# Patient Record
Sex: Female | Born: 1950 | Race: White | Hispanic: No | Smoking: Current every day smoker
Health system: Southern US, Community
[De-identification: ages and names within clinical notes are randomized; demographics above are authoritative.]

## PROBLEM LIST (undated history)

## (undated) DIAGNOSIS — I10 Essential (primary) hypertension: Secondary | ICD-10-CM

## (undated) DIAGNOSIS — T7840XA Allergy, unspecified, initial encounter: Secondary | ICD-10-CM

## (undated) DIAGNOSIS — J439 Emphysema, unspecified: Secondary | ICD-10-CM

## (undated) DIAGNOSIS — F32A Depression, unspecified: Secondary | ICD-10-CM

## (undated) DIAGNOSIS — D649 Anemia, unspecified: Secondary | ICD-10-CM

## (undated) DIAGNOSIS — J45909 Unspecified asthma, uncomplicated: Secondary | ICD-10-CM

## (undated) DIAGNOSIS — F329 Major depressive disorder, single episode, unspecified: Secondary | ICD-10-CM

## (undated) DIAGNOSIS — M1990 Unspecified osteoarthritis, unspecified site: Secondary | ICD-10-CM

## (undated) DIAGNOSIS — F419 Anxiety disorder, unspecified: Secondary | ICD-10-CM

## (undated) DIAGNOSIS — K219 Gastro-esophageal reflux disease without esophagitis: Secondary | ICD-10-CM

## (undated) DIAGNOSIS — N200 Calculus of kidney: Secondary | ICD-10-CM

## (undated) HISTORY — DX: Major depressive disorder, single episode, unspecified: F32.9

## (undated) HISTORY — PX: APPENDECTOMY: SHX54

## (undated) HISTORY — DX: Anxiety disorder, unspecified: F41.9

## (undated) HISTORY — DX: Unspecified osteoarthritis, unspecified site: M19.90

## (undated) HISTORY — PX: HERNIA REPAIR: SHX51

## (undated) HISTORY — DX: Gastro-esophageal reflux disease without esophagitis: K21.9

## (undated) HISTORY — DX: Unspecified asthma, uncomplicated: J45.909

## (undated) HISTORY — DX: Essential (primary) hypertension: I10

## (undated) HISTORY — DX: Calculus of kidney: N20.0

## (undated) HISTORY — PX: CHOLECYSTECTOMY: SHX55

## (undated) HISTORY — PX: ABDOMINAL HYSTERECTOMY: SHX81

## (undated) HISTORY — DX: Depression, unspecified: F32.A

## (undated) HISTORY — DX: Emphysema, unspecified: J43.9

## (undated) HISTORY — DX: Allergy, unspecified, initial encounter: T78.40XA

## (undated) HISTORY — DX: Anemia, unspecified: D64.9

---
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
## 2001-04-28 ENCOUNTER — Encounter: Payer: Self-pay | Admitting: Family Medicine

## 2001-04-28 ENCOUNTER — Encounter: Admission: RE | Admit: 2001-04-28 | Discharge: 2001-04-28 | Payer: Self-pay | Admitting: Family Medicine

## 2007-09-02 ENCOUNTER — Emergency Department (HOSPITAL_COMMUNITY): Admission: EM | Admit: 2007-09-02 | Discharge: 2007-09-02 | Payer: Self-pay | Admitting: Emergency Medicine

## 2007-12-16 ENCOUNTER — Emergency Department (HOSPITAL_COMMUNITY): Admission: EM | Admit: 2007-12-16 | Discharge: 2007-12-16 | Payer: Self-pay | Admitting: Emergency Medicine

## 2009-06-08 ENCOUNTER — Emergency Department (HOSPITAL_BASED_OUTPATIENT_CLINIC_OR_DEPARTMENT_OTHER): Admission: EM | Admit: 2009-06-08 | Discharge: 2009-06-08 | Payer: Self-pay | Admitting: Emergency Medicine

## 2009-06-08 ENCOUNTER — Ambulatory Visit: Payer: Self-pay | Admitting: Diagnostic Radiology

## 2010-10-20 ENCOUNTER — Ambulatory Visit: Payer: Self-pay | Admitting: Diagnostic Radiology

## 2010-10-20 ENCOUNTER — Emergency Department (HOSPITAL_BASED_OUTPATIENT_CLINIC_OR_DEPARTMENT_OTHER): Admission: EM | Admit: 2010-10-20 | Discharge: 2010-10-20 | Payer: Self-pay | Admitting: Emergency Medicine

## 2010-10-29 ENCOUNTER — Emergency Department (HOSPITAL_BASED_OUTPATIENT_CLINIC_OR_DEPARTMENT_OTHER): Admission: EM | Admit: 2010-10-29 | Discharge: 2010-10-29 | Payer: Self-pay | Admitting: Emergency Medicine

## 2010-10-29 ENCOUNTER — Ambulatory Visit: Payer: Self-pay | Admitting: Diagnostic Radiology

## 2011-03-06 LAB — BASIC METABOLIC PANEL
Calcium: 8.9 mg/dL (ref 8.4–10.5)
Creatinine, Ser: 0.7 mg/dL (ref 0.4–1.2)
GFR calc Af Amer: >60 mL/min (ref >60)
Glucose, Bld: 95 mg/dL (ref 70–99)
Potassium: 3.8 mEq/L (ref 3.5–5.1)
Sodium: 145 mEq/L (ref 135–145)

## 2011-03-06 LAB — CBC
HCT: 40 % (ref 36.0–46.0)
MCH: 30 pg (ref 26.0–34.0)
Platelets: 286 10*3/uL (ref 150–400)
RDW: 12.4 % (ref 11.5–15.5)
WBC: 7.4 10*3/uL (ref 4.0–10.5)

## 2011-03-06 LAB — URINALYSIS, ROUTINE W REFLEX MICROSCOPIC
Glucose, UA: NEGATIVE mg/dL
Specific Gravity, Urine: 1.004 — ABNORMAL LOW (ref 1.005–1.030)

## 2011-03-06 LAB — URINE CULTURE

## 2011-03-06 LAB — GLUCOSE, CAPILLARY

## 2011-03-06 LAB — POCT CARDIAC MARKERS: Troponin i, poc: <0.05 ng/mL (ref 0.00–0.09)

## 2013-06-02 ENCOUNTER — Ambulatory Visit: Payer: Self-pay | Admitting: Family Medicine

## 2014-07-27 ENCOUNTER — Emergency Department (HOSPITAL_BASED_OUTPATIENT_CLINIC_OR_DEPARTMENT_OTHER): Payer: Self-pay

## 2014-07-27 ENCOUNTER — Encounter (HOSPITAL_BASED_OUTPATIENT_CLINIC_OR_DEPARTMENT_OTHER): Payer: Self-pay | Admitting: Emergency Medicine

## 2014-07-27 ENCOUNTER — Emergency Department (HOSPITAL_BASED_OUTPATIENT_CLINIC_OR_DEPARTMENT_OTHER)
Admission: EM | Admit: 2014-07-27 | Discharge: 2014-07-27 | Disposition: A | Discharge status: Home / Self Care | Payer: Self-pay | Attending: Emergency Medicine | Admitting: Emergency Medicine

## 2014-07-27 DIAGNOSIS — H9311 Tinnitus, right ear: Secondary | ICD-10-CM

## 2014-07-27 DIAGNOSIS — H9319 Tinnitus, unspecified ear: Secondary | ICD-10-CM | POA: Insufficient documentation

## 2014-07-27 DIAGNOSIS — R5381 Other malaise: Secondary | ICD-10-CM | POA: Insufficient documentation

## 2014-07-27 DIAGNOSIS — R42 Dizziness and giddiness: Secondary | ICD-10-CM | POA: Insufficient documentation

## 2014-07-27 DIAGNOSIS — F17200 Nicotine dependence, unspecified, uncomplicated: Secondary | ICD-10-CM | POA: Insufficient documentation

## 2014-07-27 DIAGNOSIS — J189 Pneumonia, unspecified organism: Secondary | ICD-10-CM

## 2014-07-27 DIAGNOSIS — R5383 Other fatigue: Secondary | ICD-10-CM | POA: Insufficient documentation

## 2014-07-27 DIAGNOSIS — J159 Unspecified bacterial pneumonia: Secondary | ICD-10-CM | POA: Insufficient documentation

## 2014-07-27 LAB — URINALYSIS, ROUTINE W REFLEX MICROSCOPIC
Bilirubin Urine: NEGATIVE
Glucose, UA: NEGATIVE mg/dL
HGB URINE DIPSTICK: NEGATIVE
KETONES UR: NEGATIVE mg/dL
Leukocytes, UA: NEGATIVE
Nitrite: NEGATIVE
Protein, ur: NEGATIVE mg/dL
Specific Gravity, Urine: 1.005 (ref 1.005–1.030)
UROBILINOGEN UA: 0.2 mg/dL (ref 0.0–1.0)
pH: 7 (ref 5.0–8.0)

## 2014-07-27 LAB — CBC WITH DIFFERENTIAL/PLATELET
Basophils Absolute: 0.1 10*3/uL (ref 0.0–0.1)
Basophils Relative: 1 % (ref 0–1)
EOS PCT: 5 % (ref 0–5)
Eosinophils Absolute: 0.5 10*3/uL (ref 0.0–0.7)
HCT: 39.5 % (ref 36.0–46.0)
HEMOGLOBIN: 13.1 g/dL (ref 12.0–15.0)
Lymphocytes Relative: 42 % (ref 12–46)
Lymphs Abs: 4.3 10*3/uL — ABNORMAL HIGH (ref 0.7–4.0)
MCH: 30.1 pg (ref 26.0–34.0)
MCHC: 33.2 g/dL (ref 30.0–36.0)
MCV: 90.8 fL (ref 78.0–100.0)
Monocytes Absolute: 0.9 10*3/uL (ref 0.1–1.0)
Monocytes Relative: 9 % (ref 3–12)
Neutro Abs: 4.5 10*3/uL (ref 1.7–7.7)
Neutrophils Relative %: 44 % (ref 43–77)
PLATELETS: 300 10*3/uL (ref 150–400)
RBC: 4.35 MIL/uL (ref 3.87–5.11)
RDW: 13.5 % (ref 11.5–15.5)
WBC: 10.3 10*3/uL (ref 4.0–10.5)

## 2014-07-27 LAB — COMPREHENSIVE METABOLIC PANEL
ALBUMIN: 3.7 g/dL (ref 3.5–5.2)
ALT: 18 U/L (ref 0–35)
ANION GAP: 11 (ref 5–15)
AST: 20 U/L (ref 0–37)
Alkaline Phosphatase: 95 U/L (ref 39–117)
BUN: 8 mg/dL (ref 6–23)
CO2: 29 mEq/L (ref 19–32)
CREATININE: 0.7 mg/dL (ref 0.50–1.10)
Calcium: 9.5 mg/dL (ref 8.4–10.5)
Chloride: 101 mEq/L (ref 96–112)
GFR calc Af Amer: >90 mL/min (ref >90)
GLUCOSE: 96 mg/dL (ref 70–99)
POTASSIUM: 3.8 meq/L (ref 3.7–5.3)
SODIUM: 141 meq/L (ref 137–147)
Total Bilirubin: 0.2 mg/dL — ABNORMAL LOW (ref 0.3–1.2)
Total Protein: 7.3 g/dL (ref 6.0–8.3)

## 2014-07-27 MED ORDER — ONDANSETRON HCL 4 MG PO TABS: 4.0000 mg | ORAL_TABLET | Freq: Four times a day (QID) | ORAL | Status: DC

## 2014-07-27 MED ORDER — ONDANSETRON 4 MG PO TBDP
4.0000 mg | ORAL_TABLET | Freq: Once | ORAL | Status: AC
  Administered 2014-07-27: 4 mg via ORAL

## 2014-07-27 MED ORDER — LORATADINE 10 MG PO TABS: 10.0000 mg | ORAL_TABLET | Freq: Every day | ORAL | Status: DC

## 2014-07-27 MED ORDER — SODIUM CHLORIDE 0.9 % IV BOLUS (SEPSIS)
1000.0000 mL | Freq: Once | INTRAVENOUS | Status: AC
  Administered 2014-07-27: 1000 mL via INTRAVENOUS

## 2014-07-27 MED ORDER — ONDANSETRON 4 MG PO TBDP
ORAL_TABLET | ORAL | Status: AC
  Filled 2014-07-27: qty 1

## 2014-07-27 MED ORDER — AMOXICILLIN 500 MG PO CAPS: 1000.0000 mg | ORAL_CAPSULE | Freq: Two times a day (BID) | ORAL | Status: DC

## 2014-07-27 NOTE — ED Provider Notes (Signed)
CSN: [id number]     Arrival date & time 07/27/14  1837 History   First MD Initiated Contact with Patient 07/27/14 1910     Chief Complaint  Patient presents with  . Dizziness     (Consider location/radiation/quality/duration/timing/severity/associated sxs/prior Treatment) Patient is a 63 y.o. female presenting with dizziness. The history is provided by the patient and a relative. No language interpreter was used.  Dizziness Quality:  Lightheadedness Severity:  Moderate Duration:  2 weeks Context: bending over, ear pain and standing up   Context: not with loss of consciousness   Associated symptoms: shortness of breath   Associated symptoms: no chest pain, no diarrhea, no headaches and no nausea   Associated symptoms comment:  She complains of lightheadedness without a sense of room-spinning. She feels lightheaded when she stands or bends forward. It started 2 weeks ago and is associated with mild nausea without vomiting, right ear pain and tinnitus without hearing loss and generalized fatigue and malaise. She has lost her appetite. She reports a large weight loss in the last 3 months. No pain other than right ear, no diarrhea, rashes or syncope. She does reports a cough and mild SOB but reports she is a smoker and this is not necessarily abnormal. No known fever.    History reviewed. No pertinent past medical history. Past Surgical History  Procedure Laterality Date  . Abdominal hysterectomy    . Hernia repair    . Splenectomy     No family history on file. History  Substance Use Topics  . Smoking status: Current Every Day Smoker  . Smokeless tobacco: Not on file  . Alcohol Use: No   OB History   Grav Para Term Preterm Abortions TAB SAB Ect Mult Living                 Review of Systems  Constitutional: Positive for appetite change and unexpected weight change. Negative for fever and chills.  HENT: Positive for ear pain and rhinorrhea. Negative for congestion, ear discharge  and sore throat.   Respiratory: Positive for cough and shortness of breath.   Cardiovascular: Negative.  Negative for chest pain.  Gastrointestinal: Negative.  Negative for nausea, abdominal pain and diarrhea.  Genitourinary: Negative.  Negative for dysuria.  Musculoskeletal: Negative.   Skin: Negative.  Negative for rash.  Neurological: Positive for light-headedness. Negative for syncope and headaches.  Psychiatric/Behavioral: Negative for confusion.      Allergies  Review of patient's allergies indicates no known allergies.  Home Medications   Prior to Admission medications   Not on File   BP 122/80  Pulse 82  Temp(Src) 98.2 F (36.8 C) (Oral)  Resp 16  Ht 5\' 4"  (1.626 m)  SpO2 93% Physical Exam  Constitutional: She is oriented to person, place, and time. She appears well-developed and well-nourished.  HENT:  Head: Normocephalic.  Left Ear: External ear normal.  Mouth/Throat: Oropharynx is clear and moist.  Right TM erythematous along upper border.  Eyes: Conjunctivae are normal.  Neck: Normal range of motion. Neck supple.  Cardiovascular: Normal rate and regular rhythm.   Pulmonary/Chest: Effort normal and breath sounds normal.  Abdominal: Soft. Bowel sounds are normal. There is no tenderness. There is no rebound and no guarding.  Musculoskeletal: Normal range of motion. She exhibits no edema.  Neurological: She is alert and oriented to person, place, and time.  No lateralizing weakness. Speech clear and focused. No facial asymmetry or deficits of coordination. CN's 3-12 grossly intact.  Skin: Skin is warm and dry. No rash noted.  Psychiatric: She has a normal mood and affect.    ED Course  Procedures (including critical care time) Labs Review Labs Reviewed  CBC WITH DIFFERENTIAL - Abnormal; Notable for the following:    Lymphs Abs 4.3 (*)    All other components within normal limits  COMPREHENSIVE METABOLIC PANEL - Abnormal; Notable for the following:     Total Bilirubin 0.2 (*)    All other components within normal limits  URINALYSIS, ROUTINE W REFLEX MICROSCOPIC    Imaging Review Dg Chest 2 View  07/27/2014   CLINICAL DATA:  Dizziness  EXAM: CHEST  2 VIEW  COMPARISON:  10/29/2010  FINDINGS: Vague density left lung base was not present previously and could represent pneumonia in the lingula. Right lung is clear. No pleural effusion or heart failure.  IMPRESSION: Question early infiltrate in the lingula versus is overlying soft tissues.   Electronically Signed   By: Franchot Gallo M.D.   On: 07/27/2014 21:53   Ct Head Wo Contrast  07/27/2014   CLINICAL DATA:  Dizziness  EXAM: CT HEAD WITHOUT CONTRAST  TECHNIQUE: Contiguous axial images were obtained from the base of the skull through the vertex without intravenous contrast.  COMPARISON:  None.  FINDINGS: Mild motion degrades image quality.  Ventricle size is normal. Negative for acute or chronic infarction. Negative for hemorrhage or fluid collection. Negative for mass or edema. No shift of the midline structures.  Calvarium is intact.  IMPRESSION: Negative   Electronically Signed   By: Franchot Gallo M.D.   On: 07/27/2014 21:06     EKG Interpretation None      MDM   Final diagnoses:  None    1. Tinnitus 2. Pneumonia, CAP 3. Weakness  The patient is non-toxic, appears fatigued but well. No neurologic deficts, no evidence to suggest sepsis or CVA. Symptoms are not vertiginous.  Discussed with Dr. Canary Brim. Will provide treatment for PNA, antihistamines to aid tinnitus. Given resource list for primary care and return precautions.  Dewaine Oats, PA-C 07/30/14 1232

## 2014-07-27 NOTE — Discharge Instructions (Signed)
Tinnitus Sounds you hear in your ears and coming from within the ear is called tinnitus. This can be a symptom of many ear disorders. It is often associated with hearing loss.  Tinnitus can be seen with:  Infections.  Ear blockages such as wax buildup.  Meniere's disease.  Ear damage.  Inherited.  Occupational causes. While irritating, it is not usually a threat to health. When the cause of the tinnitus is wax, infection in the middle ear, or foreign body it is easily treated. Hearing loss will usually be reversible.  TREATMENT  When treating the underlying cause does not get rid of tinnitus, it may be necessary to get rid of the unwanted sound by covering it up with more pleasant background noises. This may include music, the radio etc. There are tinnitus maskers which can be worn which produce background noise to cover up the tinnitus. Avoid all medications which tend to make tinnitus worse such as alcohol, caffeine, aspirin, and nicotine. There are many soothing background tapes such as rain, ocean, thunderstorms, etc. These soothing sounds help with sleeping or resting. Keep all follow-up appointments and referrals. This is important to identify the cause of the problem. It also helps avoid complications, impaired hearing, disability, or chronic pain. Document Released: 12/10/2005 Document Revised: 03/03/2012 Document Reviewed: 07/28/2008 [facility] Patient Information 2015 [place], [place]. This information is not intended to replace advice given to you by your health care provider. Make sure you discuss any questions you have with your health care provider. Pneumonia Pneumonia is an infection of the lungs.  CAUSES Pneumonia may be caused by bacteria or a virus. Usually, these infections are caused by breathing infectious particles into the lungs (respiratory tract). SIGNS AND SYMPTOMS   Cough.  Fever.  Chest pain.  Increased rate of breathing.  Wheezing.  Mucus  production. DIAGNOSIS  If you have the common symptoms of pneumonia, your health care provider will typically confirm the diagnosis with a chest X-ray. The X-ray will show an abnormality in the lung (pulmonary infiltrate) if you have pneumonia. Other tests of your blood, urine, or sputum may be done to find the specific cause of your pneumonia. Your health care provider may also do tests (blood gases or pulse oximetry) to see how well your lungs are working. TREATMENT  Some forms of pneumonia may be spread to other people when you cough or sneeze. You may be asked to wear a mask before and during your exam. Pneumonia that is caused by bacteria is treated with antibiotic medicine. Pneumonia that is caused by the influenza virus may be treated with an antiviral medicine. Most other viral infections must run their course. These infections will not respond to antibiotics.  HOME CARE INSTRUCTIONS   Cough suppressants may be used if you are losing too much rest. However, coughing protects you by clearing your lungs. You should avoid using cough suppressants if you can.  Your health care provider may have prescribed medicine if he or she thinks your pneumonia is caused by bacteria or influenza. Finish your medicine even if you start to feel better.  Your health care provider may also prescribe an expectorant. This loosens the mucus to be coughed up.  Take medicines only as directed by your health care provider.  Do not smoke. Smoking is a common cause of bronchitis and can contribute to pneumonia. If you are a smoker and continue to smoke, your cough may last several weeks after your pneumonia has cleared.  A cold steam vaporizer  or humidifier in your room or home may help loosen mucus.  Coughing is often worse at night. Sleeping in a semi-upright position in a recliner or using a couple pillows under your head will help with this.  Get rest as you feel it is needed. Your body will usually let you know  when you need to rest. PREVENTION A pneumococcal shot (vaccine) is available to prevent a common bacterial cause of pneumonia. This is usually suggested for:  People over 51 years old.  Patients on chemotherapy.  People with chronic lung problems, such as bronchitis or emphysema.  People with immune system problems. If you are over 65 or have a high risk condition, you may receive the pneumococcal vaccine if you have not received it before. In some countries, a routine influenza vaccine is also recommended. This vaccine can help prevent some cases of pneumonia.You may be offered the influenza vaccine as part of your care. If you smoke, it is time to quit. You may receive instructions on how to stop smoking. Your health care provider can provide medicines and counseling to help you quit. SEEK MEDICAL CARE IF: You have a fever. SEEK IMMEDIATE MEDICAL CARE IF:   Your illness becomes worse. This is especially true if you are elderly or weakened from any other disease.  You cannot control your cough with suppressants and are losing sleep.  You begin coughing up blood.  You develop pain which is getting worse or is uncontrolled with medicines.  Any of the symptoms which initially brought you in for treatment are getting worse rather than better.  You develop shortness of breath or chest pain. MAKE SURE YOU:   Understand these instructions.  Will watch your condition.  Will get help right away if you are not doing well or get worse. Document Released: 12/10/2005 Document Revised: 04/26/2014 Document Reviewed: 03/01/2011 [facility] Patient Information 2015 [place], [place]. This information is not intended to replace advice given to you by your health care provider. Make sure you discuss any questions you have with your health care provider.   Emergency Department Resource Guide 1) Find a Doctor and Pay Out of Pocket Although you won't have to find out who is covered by your insurance  plan, it is a good idea to ask around and get recommendations. You will then need to call the office and see if the doctor you have chosen will accept you as a new patient and what types of options they offer for patients who are self-pay. Some doctors offer discounts or will set up payment plans for their patients who do not have insurance, but you will need to ask so you aren't surprised when you get to your appointment.  2) Contact Your Local Health Department Not all health departments have doctors that can see patients for sick visits, but many do, so it is worth a call to see if yours does. If you don't know where your local health department is, you can check in your phone book. The CDC also has a tool to help you locate your state's health department, and many state websites also have listings of all of their local health departments.  3) Find a [facility] If your illness is not likely to be very severe or complicated, you may want to try a walk in clinic. These are popping up all over the country in pharmacies, drugstores, and shopping centers. They're usually staffed by nurse practitioners or physician assistants that have been trained to treat common  illnesses and complaints. They're usually fairly quick and inexpensive. However, if you have serious medical issues or chronic medical problems, these are probably not your best option.  No Primary Care Doctor: - Call Health Connect at  [phone] - they can help you locate a primary care doctor that  accepts your insurance, provides certain services, etc. - Physician Referral Service- [phone]  Chronic Pain Problems: Organization         Address  Phone   Notes  [facility]  [phone] Patients need to be referred by their primary care doctor.   Medication Assistance: Organization         Address  Phone   Notes  [facility] Medication [facility] [place], [place] [address]  [phone] --Must be a resident of [facility] -- Must have NO insurance coverage whatsoever (no Medicaid/ Medicare, etc.) -- The pt. MUST have a primary care doctor that directs their care regularly and follows them in the community   MedAssist  [phone]   [employer]  [phone]    Agencies that provide inexpensive medical care: Organization         Address  Phone   Notes  [place]  [phone]   [facility]    [phone]   [facility] [place], [place] [address] [phone]   [place] [address], [place] [phone]   Planned Parenthood    [phone]   [facility]    [phone]   [place] and [place] [address], [place] Phone:  [phone], Fax:  [phone] Hours of Operation:  9 am - 6 pm, M-F.  Also accepts Medicaid/Medicare and self-pay.  [facility] for [place] [address], [address], [place] Phone: [phone], Fax: [phone]. Hours of Operation:  8:30 am - 5:30 pm, M-F.  Also accepts Medicaid and self-pay.  [facility] [place] [address], [place] Phone: [phone]   [place], [place], [place] [phone], Ext. [phone] Mondays & Thursdays: 7-9 AM.  First 15 patients are seen on a first come, first serve basis.    [place] Providers:  Organization         Address  Phone   Notes  [facility] [address], [place] and [place] [phone] Also accepts self-pay patients.  [facility] [address], [place]  [phone]   [place], [address], [place] [phone]   [facility] [address] [place] [phone]   Lucianne Lei [address], [place]   [phone] Only accepts [place] Access [place] patients after they have their name applied to their card.   Self-Pay (no insurance) in [facility]:  Organization         Address  Phone   Notes  Sickle Cell Patients, [facility] [place] [phone]   [facility] Urgent Care [place] [phone]   [facility] [place]  [place], [place], [place] [phone]   [facility]/Dr. Osei-Bonsu  [address] [place] or [address], [address],  [place] [phone] Phone number for both [facility] and [place] locations is the same.  Urgent Medical and [facility] [address], [place] [phone]   [facility] [address], [place] or [address] [phone]   [facility] [address], [place] [phone], phone; [phone], fax Sees patients 1st and 3rd Saturday of every month.  Must not qualify for public or private insurance (i.e. Medicaid, Medicare, [place] Health Choice, Veterans' Benefits)  Household income should be no more than 200% of the poverty level The clinic cannot treat you if you are pregnant or think you are pregnant  Sexually transmitted diseases are not treated at the clinic.    Dental Care: Organization         Address  Phone  Notes  [facility] Department of [facility] [place] [phone] Accepts children up to age 44 who are enrolled in [place] or [place]; pregnant women with a Medicaid card; and children who have applied for Medicaid or [place] Health Choice, but were declined, whose parents can pay a reduced fee at time of service.  [facility] Department of [facility]  [address], [place] [phone] Accepts children up to age 108 who are enrolled in [place] or [place]; pregnant women with a Medicaid card; and children who have applied for Medicaid or [place] Health Choice, but were declined, whose parents can pay a reduced fee at time of service.  [place] Adult Dental Access PROGRAM  [place] [phone] Patients are seen by appointment only. Walk-ins are not accepted. [place] will see patients 8 years of age and older. Monday - Tuesday (8am-5pm) Most Wednesdays (8:30-5pm) $30 per visit, cash only  [facility] Adult Dental Access PROGRAM  [address], [place] [phone] Patients are seen by appointment only. Walk-ins are not accepted. [place] will see patients 6 years of age and older. One Wednesday Evening (Monthly: Volunteer Based).  $30 per visit, cash only  [place]  [phone] for adults; Children under age 38, call Graduate Pediatric Dentistry at [phone]. Children aged 1-14, please call [phone] to request a pediatric application.  Dental services are provided in all areas of dental care including fillings, crowns and bridges, complete and partial dentures, implants, gum treatment, root canals, and extractions. Preventive care is also provided. Treatment is provided to both adults and children. Patients are selected via a lottery and there is often a waiting list.   [facility] [address], [place]  [phone] [email or web address]   [facility] [address] [place], [place] [phone], Ext. [phone] Second and Fourth Thursday of each month, opens at 6:30 AM; Clinic ends at 9 AM.  Patients are seen on a first-come first-served basis, and a limited number are seen during each clinic.   [facility]  [address] [place]ard Danker [place], [place] [phone]   Eligibility Requirements You must have lived in [place], [place], or [place] counties for at least the last three months.   You cannot be eligible for state or  federal sponsored [employer], including [employer], [place], or [employer].   You generally cannot be eligible for healthcare insurance through your employer.    How to apply: Eligibility screenings are held every Tuesday and  Wednesday afternoon from 1:00 pm until 4:00 pm. You do not need an appointment for the interview!  [facility] [address] [place], [place]   [place]  [place] Department  [place]  [phone]    Behavioral Health Resources in the Community: Intensive Outpatient Programs Organization         Address  Phone  Notes  [place]. [address], [place], [place] [phone]   [facility] [address], [place], [place]   ADS: Alcohol & Drug Svcs [address], [place], [place]   [place] [address],  [place], [place] or [phone]   Substance Abuse Resources Organization         Address  Phone  Notes  Alcohol and Drug Services  [phone]   [place]  [phone]   [facility] [place]   Chinita Pester  [phone]   Residential & Outpatient Substance Abuse Program  [phone]   Psychological Services Organization         Address  Phone  Notes  [facility] [place]  [place]  [phone]   [place] [address] [place] or [phone]    Mobile Crisis Teams Organization         Address  Phone  Notes  [facility], Mobile Crisis Care Unit  [phone]   [facility]  [address] [place], [place]   [place] [address], [place] [phone]    Self-Help/Support Groups Organization         Address  Phone              Notes  [place]. of [place] - variety of support groups  [place] Call for more information  Narcotics Anonymous (NA), [facility] [address], [employer] [place]  2 meetings at this location   Special educational needs teacher         Address  Phone  Notes  ASAP Residential Treatment [place],    [place]  [phone]   [facility]  [address] [place] [address], [place], [place]   [place], [place] [phone] Admissions: 8am-3pm M-F  Incentives Substance [place] [address],    [place], [place] [phone]   [facility] [address] [place], [place], [place]   [facility] [address]  [place], [place]   [facility]ight Programs - Intensive Outpatient [address], [place], [place]   [facility] ([place].,  [place], [place] [phone] or [phone]   [facility] Services (RTS) [address], [place], [place] Accepts Medicaid  [facility] [place] [address],  [place] [phone] Substance Abuse/Addiction Treatment   [facility] Organization         Address  Phone  Notes  [facility]  [phone]   Domenic Schwab, PhD [address] Arlis Porta [place], [place]   [phone] or [phone]   [place], [place] [phone]   [place] [address], [place], [place] [phone] Insurance/Medicaid/sponsorship through [employer] and  Families [address] [place], [place] [phone] [place], [place] [phone]    Dr. Adele Schilder  [phone]   [facility]  Dept. 1) [address] [place] 2) [place] 3)  [place] [address], [place] [phone]  [phone]   [place] [phone] or [phone] (After Hours)

## 2014-07-27 NOTE — ED Notes (Signed)
Pt reports she has been loosing her balance, hears a "buzzing" in her ears, congestion for several weeks. Pt says that she has been having lightheadedness and headache today

## 2014-08-02 NOTE — ED Provider Notes (Signed)
Medical screening examination/treatment/procedure(s) were performed by non-physician practitioner and as supervising physician I was immediately available for consultation/collaboration.   EKG Interpretation   Date/Time:  Tuesday July 27 2014 18:57:14 EDT Ventricular Rate:  80 PR Interval:  164 QRS Duration: 86 QT Interval:  390 QTC Calculation: 449 R Axis:   13 Text Interpretation:  Normal sinus rhythm Possible Anterolateral infarct ,  age undetermined Abnormal ECG No significant change since last tracing  Confirmed by Canary Brim  MD, [place] [phone]) on 07/27/2014 10:24:04 PM       Threasa Beards, MD 08/02/14 1505

## 2015-01-09 ENCOUNTER — Ambulatory Visit (INDEPENDENT_AMBULATORY_CARE_PROVIDER_SITE_OTHER): Payer: 59 | Admitting: Family Medicine

## 2015-01-09 VITALS — BP 142/78 | HR 74 | Temp 97.9°F | Resp 16 | Ht 63.75 in | Wt 154.0 lb

## 2015-01-09 DIAGNOSIS — G44219 Episodic tension-type headache, not intractable: Secondary | ICD-10-CM

## 2015-01-09 DIAGNOSIS — R634 Abnormal weight loss: Secondary | ICD-10-CM

## 2015-01-09 DIAGNOSIS — R42 Dizziness and giddiness: Secondary | ICD-10-CM

## 2015-01-09 DIAGNOSIS — R0981 Nasal congestion: Secondary | ICD-10-CM

## 2015-01-09 DIAGNOSIS — F17210 Nicotine dependence, cigarettes, uncomplicated: Secondary | ICD-10-CM

## 2015-01-09 DIAGNOSIS — H9313 Tinnitus, bilateral: Secondary | ICD-10-CM

## 2015-01-09 LAB — COMPREHENSIVE METABOLIC PANEL
ALBUMIN: 3.6 g/dL (ref 3.5–5.2)
ALK PHOS: 89 U/L (ref 39–117)
ALT: 13 U/L (ref 0–35)
AST: 14 U/L (ref 0–37)
BILIRUBIN TOTAL: 0.5 mg/dL (ref 0.2–1.2)
BUN: 8 mg/dL (ref 6–23)
CALCIUM: 8.9 mg/dL (ref 8.4–10.5)
CO2: 29 mEq/L (ref 19–32)
Chloride: 106 mEq/L (ref 96–112)
Creat: 0.63 mg/dL (ref 0.50–1.10)
GLUCOSE: 84 mg/dL (ref 70–99)
POTASSIUM: 4.4 meq/L (ref 3.5–5.3)
Sodium: 139 mEq/L (ref 135–145)
Total Protein: 6.6 g/dL (ref 6.0–8.3)

## 2015-01-09 LAB — POCT CBC
Granulocyte percent: 47.6 %G (ref 37–80)
HEMATOCRIT: 43.8 % (ref 37.7–47.9)
HEMOGLOBIN: 13.5 g/dL (ref 12.2–16.2)
LYMPH, POC: 3.5 — AB (ref 0.6–3.4)
MCH: 29.1 pg (ref 27–31.2)
MCHC: 30.8 g/dL — AB (ref 31.8–35.4)
MCV: 94.5 fL (ref 80–97)
MID (CBC): 0.7 (ref 0–0.9)
MPV: 7.3 fL (ref 0–99.8)
PLATELET COUNT, POC: 336 10*3/uL (ref 142–424)
POC Granulocyte: 3.8 (ref 2–6.9)
POC LYMPH %: 43.9 % (ref 10–50)
POC MID %: 8.5 %M (ref 0–12)
RBC: 4.63 M/uL (ref 4.04–5.48)
RDW, POC: 16.2 %
WBC: 7.9 10*3/uL (ref 4.6–10.2)

## 2015-01-09 LAB — POCT URINALYSIS DIPSTICK
BILIRUBIN UA: NEGATIVE
Blood, UA: NEGATIVE
Glucose, UA: NEGATIVE
Ketones, UA: NEGATIVE
Leukocytes, UA: NEGATIVE
Nitrite, UA: NEGATIVE
PH UA: 5
PROTEIN UA: NEGATIVE
SPEC GRAV UA: 1.015
Urobilinogen, UA: 0.2

## 2015-01-09 LAB — POCT UA - MICROSCOPIC ONLY
BACTERIA, U MICROSCOPIC: 0
CASTS, UR, LPF, POC: 0
CRYSTALS, UR, HPF, POC: 0
MUCUS UA: 0
RBC, urine, microscopic: 0
WBC, UR, HPF, POC: 0
YEAST UA: 0

## 2015-01-09 LAB — POCT GLYCOSYLATED HEMOGLOBIN (HGB A1C): HEMOGLOBIN A1C: 5.8

## 2015-01-09 LAB — TSH: TSH: 1.162 u[IU]/mL (ref 0.350–4.500)

## 2015-01-09 MED ORDER — FLUTICASONE PROPIONATE 50 MCG/ACT NA SUSP: 2.0000 | Freq: Every day | NASAL | Status: DC

## 2015-01-09 MED ORDER — ACETAMINOPHEN 500 MG PO TABS: 1000.0000 mg | ORAL_TABLET | Freq: Three times a day (TID) | ORAL | Status: DC | PRN

## 2015-01-09 MED ORDER — CETIRIZINE-PSEUDOEPHEDRINE ER 5-120 MG PO TB12: 1.0000 | ORAL_TABLET | Freq: Two times a day (BID) | ORAL | Status: DC

## 2015-01-09 NOTE — Progress Notes (Signed)
IDENTIFYING INFORMATION  Samantha Krueger / DOB: Oct 04, 1951 / MRN: [id number]  The patient  does not have a problem list on file.  SUBJECTIVE  CC: Tinnitus and Sinusitis   HPI: [name] is a 64 y.o. y.o. female presenting for tinnitus, ear fullness, and ear popping.  This all started in in June of 15 when she presented for the above and was diagnosed with allergies and pneumonia.  She was given Claritin along with Amoxicillin and Zofran for nausea.  She reports her pneumonia symptoms improved, but her ear symptoms did not.  As of December of 15 her left ear started ringing and popping. She denies hearing loss. She reports taking Excedrin for HA. She continues to take Claritin and reports that she is worse if she misses a dose.   She also reports episodes of dizziness that are associated with head movement.  These episodes last five or so minutes.  She denies feeling like she is going to black out.  This episodes occur sporadically.  She denies chest pain and palpitations.    She reports having frontal headaches all the time.  She denies pulsatility, vision changes, photophobia, and nausea with these.   She reports an unintentional 40 lbs weight loss over the the last year.    She  has a past medical history of Allergy; Anxiety; Arthritis; and Depression.    She currently has no medications in their medication list.  Ms. Pieratt has No Known Allergies. She  reports that she has been smoking.  She does not have any smokeless tobacco history on file. She reports that she does not drink alcohol or use illicit drugs. She  has no sexual activity history on file.  The patient  has past surgical history that includes Abdominal hysterectomy; Hernia repair; Splenectomy; and Cholecystectomy.  Her family history includes Heart disease in her mother; Hyperlipidemia in her mother.  Review of Systems  Constitutional: Negative for fever, chills and weight loss.  HENT: Positive for congestion,  sore throat and tinnitus. Negative for ear discharge and hearing loss.   Eyes: Negative for blurred vision, double vision and photophobia.  Respiratory: Positive for cough.   Cardiovascular: Negative for chest pain and palpitations.  Gastrointestinal: Positive for diarrhea. Negative for abdominal pain and constipation.  Genitourinary: Positive for frequency. Negative for dysuria and urgency.  Skin: Positive for itching (eyes, nose). Negative for rash.  Neurological: Positive for headaches.    OBJECTIVE  Blood pressure 142/78, pulse 74, temperature 97.9 F (36.6 C), temperature source Oral, resp. rate 16, height 5' 3.75" (1.619 m), weight 154 lb (69.854 kg), SpO2 97 %. The patient's body mass index is 26.65 kg/(m^2).  Physical Exam  HENT:  Head:    Right Ear: Hearing, external ear and ear canal normal. Tympanic membrane is retracted.  Left Ear: Hearing, external ear and ear canal normal. Tympanic membrane is retracted.  Ears:  Nose: Mucosal edema present. Right sinus exhibits no maxillary sinus tenderness and no frontal sinus tenderness. Left sinus exhibits no maxillary sinus tenderness and no frontal sinus tenderness.  Mouth/Throat: Uvula is midline, oropharynx is clear and moist and mucous membranes are normal.  Cardiovascular: Normal rate and regular rhythm.   Respiratory: Effort normal and breath sounds normal. No respiratory distress.    Results for orders placed or performed in visit on 01/09/15 (from the past 24 hour(s))  POCT CBC     Status: Abnormal   Collection Time: 01/09/15 12:21 PM  Result Value Ref Range  WBC 7.9 4.6 - 10.2 K/uL   Lymph, poc 3.5 (A) 0.6 - 3.4   POC LYMPH PERCENT 43.9 10 - 50 %L   MID (cbc) 0.7 0 - 0.9   POC MID % 8.5 0 - 12 %M   POC Granulocyte 3.8 2 - 6.9   Granulocyte percent 47.6 37 - 80 %G   RBC 4.63 4.04 - 5.48 M/uL   Hemoglobin 13.5 12.2 - 16.2 g/dL   HCT, POC 43.8 37.7 - 47.9 %   MCV 94.5 80 - 97 fL   MCH, POC 29.1 27 - 31.2 pg    MCHC 30.8 (A) 31.8 - 35.4 g/dL   RDW, POC 16.2 %   Platelet Count, POC 336 142 - 424 K/uL   MPV 7.3 0 - 99.8 fL  POCT glycosylated hemoglobin (Hb A1C)     Status: None   Collection Time: 01/09/15 12:33 PM  Result Value Ref Range   Hemoglobin A1C 5.8   POCT urinalysis dipstick     Status: None   Collection Time: 01/09/15 12:59 PM  Result Value Ref Range   Color, UA yellow    Clarity, UA clear    Glucose, UA neg    Bilirubin, UA neg    Ketones, UA neg    Spec Grav, UA 1.015    Blood, UA neg    pH, UA 5.0    Protein, UA neg    Urobilinogen, UA 0.2    Nitrite, UA neg    Leukocytes, UA Negative   POCT UA - Microscopic Only     Status: None   Collection Time: 01/09/15  1:01 PM  Result Value Ref Range   WBC, Ur, HPF, POC 0    RBC, urine, microscopic 0    Bacteria, U Microscopic 0    Mucus, UA 0    Epithelial cells, urine per micros 0-1    Crystals, Ur, HPF, POC 0    Casts, Ur, LPF, POC 0    Yeast, UA 0    Peak flow reading is 150, about 39% of predicted.  ASSESSMENT & PLAN  Marixa was seen today for tinnitus and sinusitis.  Diagnoses and associated orders for this visit:  Tinnitus, bilateral:  Likely secondary to sinus congestion that is driving eustachian tube dysfunction.  - POCT glycosylated hemoglobin (Hb A1C) - TSH -     Advised to avoid ASA and to decrease coffee consumtpoms  Dizziness: Likely driven by process one.  - EKG 12-Lead - Comprehensive metabolic panel - POCT CBC - POCT glycosylated hemoglobin (Hb A1C)  Sinus congestion: Allergies vs. Infectious etiology.  The latter is unlikely given a full course of appropriately dosed Amoxicillin without improvement in her symptoms.  - POCT CBC  Episodic tension-type headache, not intractable - POCT CBC  Weight loss, unintentional - POCT urinalysis dipstick - POCT UA - Microscopic Only - TSH - Peak flow meter -     Likely secondary to smoking and high probability of COPD.  Will order PFTs at next visit.    Smoking greater than 25 pack years - Peak flow meter: 39% of normal -     Check spirometry at f/u in two weeks.  -     Will investigate the possibility of receiving a helical low dose lung CT.      The patient was instructed to to call or comeback to clinic as needed, or should symptoms warrant.  Philis Fendt, MHS, PA-C [facility]  01/09/2015 1:05 PM

## 2015-01-22 ENCOUNTER — Ambulatory Visit (INDEPENDENT_AMBULATORY_CARE_PROVIDER_SITE_OTHER): Payer: 59 | Admitting: Family Medicine

## 2015-01-22 VITALS — BP 132/76 | HR 83 | Temp 98.3°F | Resp 16 | Ht 63.75 in | Wt 150.0 lb

## 2015-01-22 DIAGNOSIS — Z23 Encounter for immunization: Secondary | ICD-10-CM

## 2015-01-22 DIAGNOSIS — R634 Abnormal weight loss: Secondary | ICD-10-CM

## 2015-01-22 DIAGNOSIS — J449 Chronic obstructive pulmonary disease, unspecified: Secondary | ICD-10-CM

## 2015-01-22 DIAGNOSIS — H9311 Tinnitus, right ear: Secondary | ICD-10-CM

## 2015-01-22 DIAGNOSIS — F17210 Nicotine dependence, cigarettes, uncomplicated: Secondary | ICD-10-CM

## 2015-01-22 MED ORDER — IPRATROPIUM-ALBUTEROL 18-103 MCG/ACT IN AERO: 1.0000 | INHALATION_SPRAY | Freq: Four times a day (QID) | RESPIRATORY_TRACT | Status: DC | PRN

## 2015-01-22 MED ORDER — PSEUDOEPHEDRINE HCL 60 MG PO TABS: ORAL_TABLET | ORAL | Status: DC

## 2015-01-22 MED ORDER — ALBUTEROL SULFATE (2.5 MG/3ML) 0.083% IN NEBU
2.5000 mg | INHALATION_SOLUTION | Freq: Once | RESPIRATORY_TRACT | Status: AC
  Administered 2015-01-22: 2.5 mg via RESPIRATORY_TRACT

## 2015-01-22 MED ORDER — IPRATROPIUM BROMIDE 0.02 % IN SOLN
0.5000 mg | Freq: Once | RESPIRATORY_TRACT | Status: AC
Start: 2015-01-22 — End: 2015-01-22
  Administered 2015-01-22: 0.5 mg via RESPIRATORY_TRACT

## 2015-01-22 NOTE — Progress Notes (Addendum)
01/22/2015 at 6:34 PM  Samantha Krueger / DOB: [birth year] / MRN: [id number]  The patient has COPD (chronic obstructive pulmonary disease); Need for prophylactic vaccination and inoculation against influenza; and Need for prophylactic vaccination against Streptococcus pneumoniae (pneumococcus) on her problem list.  SUBJECTIVE  Chief compalaint: Follow-up and Tinnitus   History of present illness: Samantha Krueger is 64 y.o. frail appearing female presenting for the follow up of tinnitus.  She was seen roughly two weeks ago and was prescribed Flonase along with Zyrtec-D and reports a great improvement in her symptoms.  She denies ringing during the day time now, and reports she only hears it at night when things are quiet.  She denies palpitations, sleeplessness and anxiety associated with the medications.   She reports that she can walk roughly 100 yards and then needs stop secondary to SOB. She denies chest pain, diaphoresis, and presyncope.  She reports two "realy bad chest colds" last year. She has a 48 year history of smoking. She continues to lose weight, however she states that she "just does not eat a lot."  She continues to smoke, and is just not ready to quit.    She  has a past medical history of Allergy; Anxiety; Arthritis; and Depression.    She has a current medication list which includes the following prescription(s): acetaminophen, cetirizine-pseudoephedrine, and fluticasone.  Samantha Krueger has No Known Allergies. She  reports that she has been smoking.  She does not have any smokeless tobacco history on file. She reports that she does not drink alcohol or use illicit drugs. She  has no sexual activity history on file.  The patient  has past surgical history that includes Abdominal hysterectomy; Hernia repair; Splenectomy; and Cholecystectomy.  Her family history includes Heart disease in her mother; Hyperlipidemia in her mother.  Review of Systems  Constitutional: Negative for  fever, chills and weight loss.  HENT: Positive for congestion and tinnitus (improved). Negative for ear pain and sore throat.   Respiratory: Negative for shortness of breath.   Cardiovascular: Negative for chest pain and palpitations.  Neurological: Negative for dizziness, tingling, speech change, seizures and headaches.   Office Spirometry Results: FEV1: 1.38 liters FVC: 2.39 liters (post neb) FEV1/FVC: 57.7 % FVC  % Predicted: 79 liters FEV % Predicted: 52 liters FeF 25-75: 0.54 liters FeF 25-75 % Predicted: 2.16   OBJECTIVE   height is 5' 3.75" (1.619 m) and weight is 150 lb (68.04 kg). Her oral temperature is 98.3 F (36.8 C). Her blood pressure is 132/76 and her pulse is 83. Her respiration is 16 and oxygen saturation is 95%.  The patient's body mass index is 25.96 kg/(m^2).  Physical Exam  Constitutional: She is oriented to person, place, and time.  HENT:  Right Ear: Hearing, tympanic membrane, external ear and ear canal normal. Tympanic membrane is not erythematous and not retracted.  Left Ear: Hearing, tympanic membrane, external ear and ear canal normal. Tympanic membrane is not erythematous and not retracted.  Nose: No mucosal edema.  Cardiovascular: Normal rate.   Respiratory: She has no wheezes. She exhibits no tenderness.  GI: Soft. She exhibits no distension.  Neurological: She is alert and oriented to person, place, and time. No cranial nerve deficit.  Skin: Skin is warm and dry.  Psychiatric: She has a normal mood and affect. Her behavior is normal. Judgment and thought content normal.    No results found for this or any previous visit (from the past 24 hour(s)).  ASSESSMENT & PLAN  Samantha Krueger was seen today for follow-up and tinnitus.  Diagnoses and associated orders for this visit:  Right-sided tinnitus: Greatly improved and was resolved as of 2 days ago before right sided reemergence. HPI and physical exam encouraging.  Will increase sudafed by one 60 mg tab  at 3 pm.   - pseudoephedrine (SUDAFED) 60 MG tablet; Take at 3 or 4 pm daily for tinnitus. -     Advised that she continue Zyrtec-D nightly along with daily Flonase.   -     Consider 24 hour Zyrtec-D formulation if no improvement or worsening.  Consider adding azelastine for more histamine coverage if the above fails.    Chronic obstructive pulmonary disease, unspecified COPD, unspecified chronic bronchitis type - albuterol (PROVENTIL) (2.5 MG/3ML) 0.083% nebulizer solution 2.5 mg; Take 3 mLs (2.5 mg total) by nebulization once. - ipratropium (ATROVENT) nebulizer solution 0.5 mg; Take 2.5 mLs (0.5 mg total) by nebulization once. - albuterol-ipratropium (COMBIVENT) 18-103 MCG/ACT inhaler; Inhale 1-2 puffs into the lungs every 6 (six) hours as needed for wheezing or shortness of breath -     Follow up in two weeks to assess symptoms and provide immunizations.   Unintentional weight loss: Most likely being driven by the second problem.  Patient declined referral for colonoscopy but her blood counts are reassuring in that respect.  She does not complain of chronic cough or voice change.  Will pursue chest CT screening in future.  Smoking greater than 25 pack years: Driving the second process.  Patient not ready to quit.  Hopefully she will pursue the pulmonary rehab referral to change her outlook on quitting.    The patient was instructed to to call or comeback to clinic as needed, or should symptoms warrant.  Philis Fendt, MHS, PA-C [facility] 01/22/2015 6:34 PM

## 2015-01-22 NOTE — Patient Instructions (Signed)

## 2018-01-11 ENCOUNTER — Ambulatory Visit (INDEPENDENT_AMBULATORY_CARE_PROVIDER_SITE_OTHER): Payer: Medicare Other | Admitting: Family Medicine

## 2018-01-11 ENCOUNTER — Ambulatory Visit (INDEPENDENT_AMBULATORY_CARE_PROVIDER_SITE_OTHER): Payer: Medicare Other

## 2018-01-11 VITALS — BP 148/98 | HR 91 | Temp 98.3°F | Resp 16 | Ht 63.0 in | Wt 168.0 lb

## 2018-01-11 DIAGNOSIS — F17200 Nicotine dependence, unspecified, uncomplicated: Secondary | ICD-10-CM

## 2018-01-11 DIAGNOSIS — Z5181 Encounter for therapeutic drug level monitoring: Secondary | ICD-10-CM

## 2018-01-11 DIAGNOSIS — R918 Other nonspecific abnormal finding of lung field: Secondary | ICD-10-CM

## 2018-01-11 DIAGNOSIS — R739 Hyperglycemia, unspecified: Secondary | ICD-10-CM

## 2018-01-11 DIAGNOSIS — R0781 Pleurodynia: Secondary | ICD-10-CM

## 2018-01-11 DIAGNOSIS — R05 Cough: Secondary | ICD-10-CM | POA: Diagnosis not present

## 2018-01-11 DIAGNOSIS — J90 Pleural effusion, not elsewhere classified: Secondary | ICD-10-CM | POA: Diagnosis not present

## 2018-01-11 DIAGNOSIS — T380X5A Adverse effect of glucocorticoids and synthetic analogues, initial encounter: Secondary | ICD-10-CM

## 2018-01-11 DIAGNOSIS — R0789 Other chest pain: Secondary | ICD-10-CM

## 2018-01-11 DIAGNOSIS — E663 Overweight: Secondary | ICD-10-CM | POA: Diagnosis not present

## 2018-01-11 DIAGNOSIS — R059 Cough, unspecified: Secondary | ICD-10-CM

## 2018-01-11 DIAGNOSIS — J441 Chronic obstructive pulmonary disease with (acute) exacerbation: Secondary | ICD-10-CM

## 2018-01-11 DIAGNOSIS — R7303 Prediabetes: Secondary | ICD-10-CM

## 2018-01-11 DIAGNOSIS — R030 Elevated blood-pressure reading, without diagnosis of hypertension: Secondary | ICD-10-CM

## 2018-01-11 DIAGNOSIS — Z79899 Other long term (current) drug therapy: Secondary | ICD-10-CM

## 2018-01-11 LAB — POCT CBC
Granulocyte percent: 64.6 %G (ref 37–80)
HCT, POC: 40.6 % (ref 37.7–47.9)
Hemoglobin: 13.3 g/dL (ref 12.2–16.2)
Lymph, poc: 3.3 (ref 0.6–3.4)
MCH, POC: 28.3 pg (ref 27–31.2)
MCHC: 32.8 g/dL (ref 31.8–35.4)
MCV: 86.3 fL (ref 80–97)
MID (CBC): 1 — AB (ref 0–0.9)
MPV: 7.4 fL (ref 0–99.8)
PLATELET COUNT, POC: 472 10*3/uL — AB (ref 142–424)
POC Granulocyte: 7.9 — AB (ref 2–6.9)
POC LYMPH PERCENT: 27.2 %L (ref 10–50)
POC MID %: 82 % — AB (ref 0–12)
RBC: 4.71 M/uL (ref 4.04–5.48)
RDW, POC: 13.6 %
WBC: 12.3 10*3/uL — AB (ref 4.6–10.2)

## 2018-01-11 MED ORDER — ALBUTEROL SULFATE HFA 108 (90 BASE) MCG/ACT IN AERS: 2.0000 | INHALATION_SPRAY | RESPIRATORY_TRACT | 2 refills | Status: DC | PRN

## 2018-01-11 MED ORDER — ALBUTEROL SULFATE (2.5 MG/3ML) 0.083% IN NEBU
2.5000 mg | INHALATION_SOLUTION | Freq: Once | RESPIRATORY_TRACT | Status: AC
  Administered 2018-01-11: 2.5 mg via RESPIRATORY_TRACT

## 2018-01-11 MED ORDER — GUAIFENESIN-CODEINE 100-10 MG/5ML PO SYRP: 10.0000 mL | ORAL_SOLUTION | Freq: Four times a day (QID) | ORAL | 0 refills | Status: DC | PRN

## 2018-01-11 MED ORDER — PREDNISONE 20 MG PO TABS: 40.0000 mg | ORAL_TABLET | Freq: Every day | ORAL | 0 refills | Status: DC

## 2018-01-11 MED ORDER — IPRATROPIUM BROMIDE 0.02 % IN SOLN
0.5000 mg | Freq: Once | RESPIRATORY_TRACT | Status: AC
  Administered 2018-01-11: 0.5 mg via RESPIRATORY_TRACT

## 2018-01-11 MED ORDER — AZITHROMYCIN 250 MG PO TABS: ORAL_TABLET | ORAL | 0 refills | Status: DC

## 2018-01-11 NOTE — Patient Instructions (Addendum)
     IF you received an x-ray today, you will receive an invoice from [facility]. Please contact  Radiology at [phone] with questions or concerns regarding your invoice.   IF you received labwork today, you will receive an invoice from [facility]. Please contact [facility] at [phone] with questions or concerns regarding your invoice.   Our billing staff will not be able to assist you with questions regarding bills from these companies.  You will be contacted with the lab results as soon as they are available. The fastest way to get your results is to activate your My Chart account. Instructions are located on the last page of this paperwork. If you have not heard from us regarding the results in 2 weeks, please contact this office.      Chronic Obstructive Pulmonary Disease Exacerbation Chronic obstructive pulmonary disease (COPD) is a common lung problem. In COPD, the flow of air from the lungs is limited. COPD exacerbations are times that breathing gets worse and you need extra treatment. Without treatment they can be life threatening. If they happen often, your lungs can become more damaged. If your COPD gets worse, your doctor may treat you with:  Medicines.  Oxygen.  Different ways to clear your airway, such as using a mask.  Follow these instructions at home:  Do not smoke.  Avoid tobacco smoke and other things that bother your lungs.  If given, take your antibiotic medicine as told. Finish the medicine even if you start to feel better.  Only take medicines as told by your doctor.  Drink enough fluids to keep your pee (urine) clear or pale yellow (unless your doctor has told you not to).  Use a cool mist machine (vaporizer).  If you use oxygen or a machine that turns liquid medicine into a mist (nebulizer), continue to use them as told.  Keep up with shots (vaccinations) as told by your doctor.  Exercise regularly.  Eat healthy foods.  Keep  all doctor visits as told. Get help right away if:  You are very short of breath and it gets worse.  You have trouble talking.  You have bad chest pain.  You have blood in your spit (sputum).  You have a fever.  You keep throwing up (vomiting).  You feel weak, or you pass out (faint).  You feel confused.  You keep getting worse. This information is not intended to replace advice given to you by your health care provider. Make sure you discuss any questions you have with your health care provider. Document Released: 11/29/2011 Document Revised: 05/17/2016 Document Reviewed: 08/14/2013 Elsevier Interactive Patient Education  2017 Elsevier Inc.  

## 2018-01-11 NOTE — Progress Notes (Signed)
Subjective:  By signing my name below, I, [name], attest that this documentation has been prepared under the direction and in the presence of Delman Cheadle, MD. Electronically Signed: [name], [place]. 01/11/2018 , 4:44 PM .  Patient was seen in Room 1 .   Patient ID: Samantha Krueger, female    DOB: December 13, 1951, 67 y.o.   MRN: [id number] Chief Complaint  Patient presents with  . Cough    pt states it comes and goes, had it since summer.  . Sinusitis  . Chest Pain    12/17/17, pt got sick before and coughing and hurt ribs   HPI Samantha Krueger is a 67 y.o. female who presents to Primary Care at [facility] complaining of cough with sinus pressure and chest pain that started for a few weeks ago. Patient reports symptoms starting up about 3 weeks ago, with some improvement a week ago. However, her symptoms returned, worsening 2 days ago, and feeling worst yesterday. At times, she's had some hot and cold spells. She also reports having associated sinus pressure and congestion, as well as coughing up thick, discolored mucus (describes yellow/white). She describes having persistent chest pressure, worse with cough and difficult to take a deep breath; however, she has some improvement when holding a pillow across her chest. She also believes she broke her ribs on the left side from coughing. She's been taking OTC tylenol, cough medication and Ricola cough drops. She is currently still smoking, about a pack per day. Her last meal was this morning. She was seen in the ED in Aug 2015 for pneumonia. She denies history of inhaler use.   She also complains of feeling fatigue for several years now. She denies knowledge of snoring at night as she sleeps by herself. She states she's been taking black cherry supplement for improvement.   She doesn't have a PCP; usually seen here when feeling sick.   Past Medical History:  Diagnosis Date  . Allergy   . Anxiety   . Arthritis   . Depression    Past  Surgical History:  Procedure Laterality Date  . ABDOMINAL HYSTERECTOMY    . CHOLECYSTECTOMY    . HERNIA REPAIR    . SPLENECTOMY     Prior to Admission medications   Medication Sig Start Date End Date Taking? Authorizing Provider  acetaminophen (TYLENOL) 500 MG tablet Take 2 tablets (1,000 mg total) by mouth every 8 (eight) hours as needed for moderate pain or headache. Take 2 tabs every 8 hours. 01/09/15   Tereasa Coop, PA-C  albuterol-ipratropium (COMBIVENT) 18-103 MCG/ACT inhaler Inhale 1-2 puffs into the lungs every 6 (six) hours as needed for wheezing or shortness of breath. 01/22/15   Tereasa Coop, PA-C  cetirizine-pseudoephedrine (ZYRTEC-D) 5-120 MG per tablet Take 1 tablet by mouth 2 (two) times daily. 01/09/15   Tereasa Coop, PA-C  fluticasone (FLONASE) 50 MCG/ACT nasal spray Place 2 sprays into both nostrils daily. 01/09/15   Tereasa Coop, PA-C  pseudoephedrine (SUDAFED) 60 MG tablet Take at 3 or 4 pm daily for tinnitus. 01/22/15   Tereasa Coop, PA-C   No Known Allergies Family History  Problem Relation Age of Onset  . Hyperlipidemia Mother   . Heart disease Mother    Social History   Socioeconomic History  . Marital status: Divorced    Spouse name: Not on file  . Number of children: Not on file  . Years of education: Not on file  .  Highest education level: Not on file  Social Needs  . Financial resource strain: Not on file  . Food insecurity - worry: Not on file  . Food insecurity - inability: Not on file  . Transportation needs - medical: Not on file  . Transportation needs - non-medical: Not on file  Occupational History  . Not on file  Tobacco Use  . Smoking status: Current Every Day Smoker    Packs/day: 0.50    Years: 48.00    Pack years: 24.00  Substance and Sexual Activity  . Alcohol use: No  . Drug use: No  . Sexual activity: Not on file  Other Topics Concern  . Not on file  Social History Narrative  . Not on file   Depression screen  [facility] 2/9 01/11/2018 01/09/2015  Decreased Interest 0 1  Down, Depressed, Hopeless 0 3  PHQ - 2 Score 0 4  Altered sleeping - 3  Tired, decreased energy - 3  Feeling bad or failure about yourself  - 1  Trouble concentrating - 1  Moving slowly or fidgety/restless - 1  Suicidal thoughts - 1  PHQ-9 Score - 14    Review of Systems  Constitutional: Positive for chills, fatigue and fever. Negative for unexpected weight change.  HENT: Positive for congestion and sinus pressure.   Respiratory: Positive for cough, chest tightness and shortness of breath.   Cardiovascular: Positive for chest pain.  Gastrointestinal: Negative for constipation, diarrhea, nausea and vomiting.  Skin: Negative for rash and wound.  Neurological: Negative for dizziness, weakness and headaches.       Objective:   Physical Exam  Constitutional: She is oriented to person, place, and time. She appears well-developed and well-nourished. No distress.  HENT:  Head: Normocephalic and atraumatic.  Right Ear: Tympanic membrane normal.  Left Ear: Tympanic membrane normal.  Nose: Rhinorrhea present.  Mouth/Throat: Oropharynx is clear and moist.  Nares with purulent rhinitis  Eyes: EOM are normal. Pupils are equal, round, and reactive to light.  Neck: Neck supple. No thyromegaly present.  Cardiovascular: Normal rate, regular rhythm, S1 normal, S2 normal and normal heart sounds.  No murmur heard. Pulmonary/Chest: Effort normal. No respiratory distress. She has decreased breath sounds.  decreased air movement throughout  Musculoskeletal: Normal range of motion.  Lymphadenopathy:    She has no cervical adenopathy.  Neurological: She is alert and oriented to person, place, and time.  Skin: Skin is warm and dry.  Psychiatric: She has a normal mood and affect. Her behavior is normal.  Nursing note and vitals reviewed.   BP (!) 148/98   Pulse 91   Temp 98.3 F (36.8 C) (Oral)   Resp 16   Ht 5\' 3"  (1.6 m)   Wt 168 lb  (76.2 kg)   SpO2 95%   BMI 29.76 kg/m     EKG: NSR, no acute ischemic changes noted. No significant change noted when compared to prior EKG done 01/09/2015.   I have personally reviewed the EKG tracing and agree with the computer interpretation.  Assessment & Plan:   1. Cough   2. Rib pain on right side   3. Chest tightness or pressure   4. Tobacco use disorder   5. Elevated blood pressure reading   6. Overweight (BMI 25.0-29.9)   7. COPD exacerbation ([place])   8. Abnormal findings on diagnostic imaging of lung   9. Pleural effusion, not elsewhere classified     Orders Placed This Encounter  Procedures  . DG  Ribs Unilateral W/Chest Right    Standing Status:   Future    Number of Occurrences:   1    Standing Expiration Date:   01/11/2019    Order Specific Question:   Reason for Exam (SYMPTOM  OR DIAGNOSIS REQUIRED)    Answer:   cough, bilateral anterior and posterior chest pain, severe right lateral pain    Order Specific Question:   Preferred imaging location?    Answer:   External  . CT Chest W Contrast    Standing Status:   Future    Standing Expiration Date:   03/12/2019    Order Specific Question:   If indicated for the ordered procedure, I authorize the administration of contrast media per Radiology protocol    Answer:   Yes    Order Specific Question:   Preferred imaging location?    Answer:   [facility]    Order Specific Question:   Radiology Contrast Protocol - do NOT remove file path    Answer:   [email or web address]  . Comprehensive metabolic panel  . POCT CBC  . EKG 12-Lead    Meds ordered this encounter  Medications  . albuterol (PROVENTIL) (2.5 MG/3ML) 0.083% nebulizer solution 2.5 mg  . ipratropium (ATROVENT) nebulizer solution 0.5 mg  . albuterol (PROVENTIL HFA;VENTOLIN HFA) 108 (90 Base) MCG/ACT inhaler    Sig: Inhale 2 puffs into the lungs every 4 (four) hours as needed for wheezing or shortness of breath (or cough).     Dispense:  1 Inhaler    Refill:  2  . predniSONE (DELTASONE) 20 MG tablet    Sig: Take 2 tablets (40 mg total) by mouth daily with breakfast.    Dispense:  10 tablet    Refill:  0  . azithromycin (ZITHROMAX) 250 MG tablet    Sig: Take 2 tabs PO x 1 dose, then 1 tab PO QD x 4 days    Dispense:  6 tablet    Refill:  0  . guaiFENesin-codeine (ROBITUSSIN AC) 100-10 MG/5ML syrup    Sig: Take 10 mLs by mouth 4 (four) times daily as needed for cough.    Dispense:  150 mL    Refill:  0    I personally performed the services described in this documentation, which was scribed in my presence. The recorded information has been reviewed and considered, and addended by me as needed.   Delman Cheadle, M.D.  Primary Care at [facility] [address] [place], [place] [address] [phone] phone [phone] fax  01/11/18 5:43 PM

## 2018-01-11 NOTE — Progress Notes (Signed)
6gt

## 2018-01-12 LAB — COMPREHENSIVE METABOLIC PANEL
A/G RATIO: 1.3 (ref 1.2–2.2)
ALT: 18 IU/L (ref 0–32)
AST: 15 IU/L (ref 0–40)
Albumin: 4.2 g/dL (ref 3.6–4.8)
Alkaline Phosphatase: 142 IU/L — ABNORMAL HIGH (ref 39–117)
BILIRUBIN TOTAL: 0.2 mg/dL (ref 0.0–1.2)
BUN/Creatinine Ratio: 11 — ABNORMAL LOW (ref 12–28)
BUN: 10 mg/dL (ref 8–27)
CHLORIDE: 100 mmol/L (ref 96–106)
CO2: 28 mmol/L (ref 20–29)
Calcium: 9.4 mg/dL (ref 8.7–10.3)
Creatinine, Ser: 0.87 mg/dL (ref 0.57–1.00)
GFR calc non Af Amer: 70 mL/min/{1.73_m2} (ref >59)
GFR, EST AFRICAN AMERICAN: 80 mL/min/{1.73_m2} (ref >59)
Globulin, Total: 3.3 g/dL (ref 1.5–4.5)
Glucose: 100 mg/dL — ABNORMAL HIGH (ref 65–99)
POTASSIUM: 4.6 mmol/L (ref 3.5–5.2)
Sodium: 141 mmol/L (ref 134–144)
Total Protein: 7.5 g/dL (ref 6.0–8.5)

## 2018-01-13 ENCOUNTER — Telehealth: Payer: Self-pay | Admitting: Family Medicine

## 2018-01-13 NOTE — Telephone Encounter (Signed)
Incoming call from [place] at [place]. She wants to make sure Dr. Brigitte Pulse is able to review Impression from DG ribs unilateral with chest right on 01/11/18:  "IMPRESSION: 1. Diffuse peribronchial cuffing, concerning for an acute bronchitis. 2. Small right pleural effusion. 3. Possible nodule noted in the right lung base on one of the oblique projections. This could represent a nodular focus of airspace consolidation in the setting of an acute infection, however, the possibility of neoplasm should be considered, particularly in light of the unexpected right pleural effusion. Further evaluation with contrast enhanced chest CT is recommended in the near future to exclude underlying neoplasm. 4. No displaced rib fractures.  These results will be called to the ordering clinician or representative by the Radiologist Assistant, and communication documented in the PACS or zVision Dashboard."  Provider, Juluis Rainier.

## 2018-01-13 NOTE — Telephone Encounter (Signed)
On 1/19 I had already written a note to the patient and ordered the chest CT. Sent note to the radiology pool as high priority but it looks like no one has addressed it yet.

## 2018-01-14 DIAGNOSIS — J90 Pleural effusion, not elsewhere classified: Secondary | ICD-10-CM | POA: Diagnosis not present

## 2018-01-14 DIAGNOSIS — Z79899 Other long term (current) drug therapy: Secondary | ICD-10-CM | POA: Diagnosis not present

## 2018-01-14 DIAGNOSIS — R739 Hyperglycemia, unspecified: Secondary | ICD-10-CM | POA: Diagnosis not present

## 2018-01-14 DIAGNOSIS — R0789 Other chest pain: Secondary | ICD-10-CM | POA: Diagnosis not present

## 2018-01-14 DIAGNOSIS — Z5181 Encounter for therapeutic drug level monitoring: Secondary | ICD-10-CM | POA: Diagnosis not present

## 2018-01-14 DIAGNOSIS — T380X5A Adverse effect of glucocorticoids and synthetic analogues, initial encounter: Secondary | ICD-10-CM | POA: Diagnosis not present

## 2018-01-14 DIAGNOSIS — R030 Elevated blood-pressure reading, without diagnosis of hypertension: Secondary | ICD-10-CM | POA: Diagnosis not present

## 2018-01-14 DIAGNOSIS — E663 Overweight: Secondary | ICD-10-CM | POA: Diagnosis not present

## 2018-01-14 DIAGNOSIS — R7303 Prediabetes: Secondary | ICD-10-CM | POA: Diagnosis not present

## 2018-01-14 NOTE — Addendum Note (Signed)
Addended by: Gari Crown D on: 01/14/2018 09:24 AM   Modules accepted: Orders

## 2018-01-15 LAB — HEMOGLOBIN A1C
ESTIMATED AVERAGE GLUCOSE: 137 mg/dL
HEMOGLOBIN A1C: 6.4 % — AB (ref 4.8–5.6)

## 2018-01-15 NOTE — Telephone Encounter (Signed)
Copied from [place]. Topic: General - Other >> Jan 15, 2018  5:27 PM Patrice Paradise wrote: Reason for CRM: Office called the patient to give lab results, ok for [facility] nurse triage to give results. Please call patient with results @ [phone]

## 2018-01-15 NOTE — Telephone Encounter (Signed)
Pt called and given results of chest xray per notes of Dr. Brigitte Pulse on 01/15/18. Pt verbalized understanding.  Unable to document under result note due to result note not being routed to [facility].

## 2018-01-16 ENCOUNTER — Telehealth: Payer: Self-pay

## 2018-01-16 NOTE — Telephone Encounter (Signed)
Called pt to schedule AWV> Pt returned phone call and declined to schedule due to being in too much pain to come in for an extra visit.    Josepha Pigg, B.A.  Care Guide - [facility] at [facility]

## 2018-01-18 ENCOUNTER — Other Ambulatory Visit: Payer: Self-pay

## 2018-01-18 ENCOUNTER — Ambulatory Visit (INDEPENDENT_AMBULATORY_CARE_PROVIDER_SITE_OTHER): Payer: Medicare Other | Admitting: Family Medicine

## 2018-01-18 ENCOUNTER — Encounter: Payer: Self-pay | Admitting: Family Medicine

## 2018-01-18 VITALS — BP 138/82 | HR 96 | Temp 98.6°F | Resp 16 | Ht 62.6 in | Wt 167.0 lb

## 2018-01-18 DIAGNOSIS — Z113 Encounter for screening for infections with a predominantly sexual mode of transmission: Secondary | ICD-10-CM | POA: Diagnosis not present

## 2018-01-18 DIAGNOSIS — Z0000 Encounter for general adult medical examination without abnormal findings: Secondary | ICD-10-CM

## 2018-01-18 DIAGNOSIS — Z9081 Acquired absence of spleen: Secondary | ICD-10-CM

## 2018-01-18 DIAGNOSIS — E2839 Other primary ovarian failure: Secondary | ICD-10-CM

## 2018-01-18 DIAGNOSIS — F17200 Nicotine dependence, unspecified, uncomplicated: Secondary | ICD-10-CM

## 2018-01-18 DIAGNOSIS — J449 Chronic obstructive pulmonary disease, unspecified: Secondary | ICD-10-CM

## 2018-01-18 DIAGNOSIS — E663 Overweight: Secondary | ICD-10-CM

## 2018-01-18 DIAGNOSIS — Z13220 Encounter for screening for lipoid disorders: Secondary | ICD-10-CM

## 2018-01-18 DIAGNOSIS — M899 Disorder of bone, unspecified: Secondary | ICD-10-CM | POA: Diagnosis not present

## 2018-01-18 DIAGNOSIS — F331 Major depressive disorder, recurrent, moderate: Secondary | ICD-10-CM

## 2018-01-18 DIAGNOSIS — Z1231 Encounter for screening mammogram for malignant neoplasm of breast: Secondary | ICD-10-CM | POA: Diagnosis not present

## 2018-01-18 DIAGNOSIS — J181 Lobar pneumonia, unspecified organism: Secondary | ICD-10-CM | POA: Diagnosis not present

## 2018-01-18 DIAGNOSIS — R9389 Abnormal findings on diagnostic imaging of other specified body structures: Secondary | ICD-10-CM | POA: Diagnosis not present

## 2018-01-18 DIAGNOSIS — Z1211 Encounter for screening for malignant neoplasm of colon: Secondary | ICD-10-CM | POA: Diagnosis not present

## 2018-01-18 DIAGNOSIS — J189 Pneumonia, unspecified organism: Secondary | ICD-10-CM

## 2018-01-18 DIAGNOSIS — R748 Abnormal levels of other serum enzymes: Secondary | ICD-10-CM | POA: Diagnosis not present

## 2018-01-18 DIAGNOSIS — R7303 Prediabetes: Secondary | ICD-10-CM | POA: Diagnosis not present

## 2018-01-18 DIAGNOSIS — Z1239 Encounter for other screening for malignant neoplasm of breast: Secondary | ICD-10-CM

## 2018-01-18 LAB — POCT URINALYSIS DIP (MANUAL ENTRY)
BILIRUBIN UA: NEGATIVE mg/dL
Bilirubin, UA: NEGATIVE
Glucose, UA: NEGATIVE mg/dL
Leukocytes, UA: NEGATIVE
Nitrite, UA: NEGATIVE
PH UA: 7 (ref 5.0–8.0)
PROTEIN UA: NEGATIVE mg/dL
RBC UA: NEGATIVE
Spec Grav, UA: 1.01 (ref 1.010–1.025)
UROBILINOGEN UA: 0.2 U/dL

## 2018-01-18 LAB — POCT CBC
Granulocyte percent: 57.5 %G (ref 37–80)
HCT, POC: 42.2 % (ref 37.7–47.9)
Hemoglobin: 13.8 g/dL (ref 12.2–16.2)
Lymph, poc: 5 — AB (ref 0.6–3.4)
MCH, POC: 28.5 pg (ref 27–31.2)
MCHC: 32.7 g/dL (ref 31.8–35.4)
MCV: 87.2 fL (ref 80–97)
MID (cbc): 1 — AB (ref 0–0.9)
MPV: 7.6 fL (ref 0–99.8)
POC Granulocyte: 8.1 — AB (ref 2–6.9)
POC LYMPH PERCENT: 35.7 %L (ref 10–50)
POC MID %: 6.8 %M (ref 0–12)
Platelet Count, POC: 441 10*3/uL — AB (ref 142–424)
RBC: 4.84 M/uL (ref 4.04–5.48)
RDW, POC: 13.8 %
WBC: 14.1 10*3/uL — AB (ref 4.6–10.2)

## 2018-01-18 MED ORDER — MUCINEX DM MAXIMUM STRENGTH 60-1200 MG PO TB12: 1.0000 | ORAL_TABLET | Freq: Two times a day (BID) | ORAL | 1 refills | Status: DC

## 2018-01-18 MED ORDER — CITALOPRAM HYDROBROMIDE 10 MG PO TABS: 10.0000 mg | ORAL_TABLET | Freq: Every day | ORAL | 3 refills | Status: AC

## 2018-01-18 MED ORDER — HYDROCODONE-ACETAMINOPHEN 5-325 MG PO TABS: 1.0000 | ORAL_TABLET | ORAL | 0 refills | Status: DC | PRN

## 2018-01-18 MED ORDER — LEVOFLOXACIN 500 MG PO TABS: 750.0000 mg | ORAL_TABLET | Freq: Every day | ORAL | 0 refills | Status: DC

## 2018-01-18 NOTE — Progress Notes (Signed)
Subjective:    Samantha Krueger is a 67 y.o. female who presents for Medicare Initial preventive examination.  Preventive Screening-Counseling & Management  Tobacco Social History   Tobacco Use  Smoking Status Current Every Day Smoker  . Packs/day: 0.50  . Years: 48.00  . Pack years: 24.00     Problems Prior to Visit 1. Taking vitamin D 1000-2000u/d. No prior dexa scan. Used to drink a lot of milk but stopped because it started tasting funny.   2. Still feeling horrible, coughing up thick yellow, white, clear. No SHoB. Occ chills. Not sleeping at all due to sxs. No SHoB. No fevers. Severe right flank/side pain.  3.  Splenectomy at 67 yo 4.   needs something for anxiety and depression - mood is down and out but needs something mild - reacts very strongly to medicine. Used to be on "nerve pills" which she did well on but no idea, did well on them. 5. H/o kidney stones    Current Problems (verified) Patient Active Problem List   Diagnosis Date Noted  . Tobacco use disorder 01/11/2018  . Overweight (BMI 25.0-29.9) 01/11/2018  . COPD (chronic obstructive pulmonary disease) ([place]) 01/22/2015    Medications Prior to Visit Current Outpatient Medications on File Prior to Visit  Medication Sig Dispense Refill  . albuterol (PROVENTIL HFA;VENTOLIN HFA) 108 (90 Base) MCG/ACT inhaler Inhale 2 puffs into the lungs every 4 (four) hours as needed for wheezing or shortness of breath (or cough). 1 Inhaler 2  . azithromycin (ZITHROMAX) 250 MG tablet Take 2 tabs PO x 1 dose, then 1 tab PO QD x 4 days 6 tablet 0  . guaiFENesin-codeine (ROBITUSSIN AC) 100-10 MG/5ML syrup Take 10 mLs by mouth 4 (four) times daily as needed for cough. 150 mL 0  . predniSONE (DELTASONE) 20 MG tablet Take 2 tablets (40 mg total) by mouth daily with breakfast. 10 tablet 0   No current facility-administered medications on file prior to visit.     Current Medications (verified) Current Outpatient Medications   Medication Sig Dispense Refill  . albuterol (PROVENTIL HFA;VENTOLIN HFA) 108 (90 Base) MCG/ACT inhaler Inhale 2 puffs into the lungs every 4 (four) hours as needed for wheezing or shortness of breath (or cough). 1 Inhaler 2  . azithromycin (ZITHROMAX) 250 MG tablet Take 2 tabs PO x 1 dose, then 1 tab PO QD x 4 days 6 tablet 0  . guaiFENesin-codeine (ROBITUSSIN AC) 100-10 MG/5ML syrup Take 10 mLs by mouth 4 (four) times daily as needed for cough. 150 mL 0  . predniSONE (DELTASONE) 20 MG tablet Take 2 tablets (40 mg total) by mouth daily with breakfast. 10 tablet 0   No current facility-administered medications for this visit.      Allergies (verified) Patient has no known allergies.   PAST HISTORY  Family History Family History  Problem Relation Age of Onset  . Hyperlipidemia Mother   . Heart disease Mother     Social History Social History   Tobacco Use  . Smoking status: Current Every Day Smoker    Packs/day: 0.50    Years: 48.00    Pack years: 24.00  Substance Use Topics  . Alcohol use: No     Are there smokers in your home (other than you)? Yes  Risk Factors Current exercise habits: The patient does not participate in regular exercise at present. Takes care of dogs and stays very busywalking them when feeling well Dietary issues discussed: used to drink milk, was  drinking a lot of sodas, and now switching over to sweet tea. Fairly health diet.  Cardiac risk factors: advanced age (older than 61 for men, 60 for women), family history of premature cardiovascular disease and smoking  Depression Screen  Depression screen [facility] 2/9 01/18/2018 01/11/2018 01/09/2015  Decreased Interest 0 0 1  Down, Depressed, Hopeless 0 0 3  PHQ - 2 Score 0 0 4  Altered sleeping - - 3  Tired, decreased energy - - 3  Feeling bad or failure about yourself  - - 1  Trouble concentrating - - 1  Moving slowly or fidgety/restless - - 1  Suicidal thoughts - - 1  PHQ-9 Score - - 14      Activities of Daily Living In your present state of health, do you have any difficulty performing the following activities?:  Driving? No Managing money?  No Feeding yourself? No Getting from bed to chair? No Climbing a flight of stairs? No Preparing food and eating?: No Bathing or showering? No Getting dressed: No Getting to the toilet? No Using the toilet:No Moving around from place to place: No In the past year have you fallen or had a near fall?:Yes - tripped over dogs   Are you sexually active?  No  Do you have more than one partner?  No  Hearing Difficulties: No Do you often ask people to speak up or repeat themselves? No Do you experience ringing or noises in your ears? No Do you have difficulty understanding soft or whispered voices? No   Do you feel that you have a problem with memory? No  Do you often misplace items? No  Do you feel safe at home?  Yes  Cognitive Testing -  Alert? Yes  Normal Appearance?Yes  Oriented to person? Yes  Place? Yes   Time? Yes  Displays appropriate judgment?Yes     Advanced Directives have been discussed with the patient? Yes - she doesn't have any none but her daughter is her nearest living relative who doesn't want to do it so would like info on HCPOA and living weill  List the Names of Other Physician/Practitioners you currently use: 1.   none  Indicate any recent Medical Services you may have received from other than [facility] providers in the past year (date may be approximate).   There is no immunization history on file for this patient.  Screening Tests Health Maintenance  Topic Date Due  . Hepatitis C Screening  [birth year]  . TETANUS/TDAP  08/05/1970  . MAMMOGRAM  08/05/2001  . COLONOSCOPY  08/05/2001  . DEXA SCAN  08/05/2016  . PNA vac Low Risk Adult (1 of 2 - PCV13) 08/05/2016  . INFLUENZA VACCINE  07/24/2017    All answers were reviewed with the patient and necessary referrals were made:  Shawnee Knapp,  MD   01/18/2018   History reviewed: allergies, current medications, past family history, past medical history, past social history, past surgical history and problem list  Review of Systems A comprehensive review of systems was negative except for: Constitutional: positive for chills, fatigue and decreased activity Eyes: positive for discharge and itching Ears, nose, mouth, throat, and face: positive for nasal congestion, tinnitus and post-nasal drip, rhinitis, sinus pressure, sneezing Respiratory: positive for cough, dyspnea on exertion, wheezing and shortness of breath, chest tightness Gastrointestinal: positive for nausea Genitourinary: positive for frequency and urinary urgency Musculoskeletal: positive for back pain Neurological: positive for lightheadedness Behavioral/Psych: positive for bad mood, depression and agitation Allergic/Immunologic: positive for  seasonal allergies    Objective:     BP 138/82   Pulse 96   Temp 98.6 F (37 C) (Oral)   Resp 16   Ht 5' 2.6" (1.59 m)   Wt 167 lb (75.8 kg)   SpO2 94%   BMI 29.96 kg/m   Visual Acuity Screening   Right eye Left eye Both eyes  Without correction: 20/25 20/25 20/25   With correction:       BP 138/82   Pulse 96   Temp 98.6 F (37 C) (Oral)   Resp 16   Ht 5' 2.6" (1.59 m)   Wt 167 lb (75.8 kg)   SpO2 94%   BMI 29.96 kg/m   General Appearance:    Alert, cooperative, no distress, appears stated age  Head:    Normocephalic, without obvious abnormality, atraumatic  Eyes:    PERRL, conjunctiva/corneas clear, EOM's intact, fundi    benign, both eyes  Ears:    Normal TM's and external ear canals, both ears  Nose:   Nares normal, septum midline, mucosa normal, no drainage    or sinus tenderness  Throat:   Lips, mucosa, and tongue normal; teeth and gums normal  Neck:   Supple, symmetrical, trachea midline, no adenopathy;    thyroid:  no enlargement/tenderness/nodules; no carotid   bruit or JVD  Back:     Symmetric, no  curvature, ROM normal, no CVA tenderness  Lungs:     Clear to auscultation bilaterally, respirations unlabored  Chest Wall:    No tenderness or deformity   Heart:    Regular rate and rhythm, S1 and S2 normal, no murmur, rub   or gallop  Breast Exam:    No tenderness, masses, or nipple abnormality  Abdomen:     Soft, non-tender, bowel sounds active all four quadrants,    no masses, no organomegaly  Genitalia:    Normal female without lesion, discharge or tenderness  Rectal:    Normal tone, normal prostate, no masses or tenderness;   guaiac negative stool  Extremities:   Extremities normal, atraumatic, no cyanosis or edema  Pulses:   2+ and symmetric all extremities  Skin:   Skin color, texture, turgor normal, no rashes or lesions  Lymph nodes:   Cervical, supraclavicular, and axillary nodes normal  Neurologic:   CNII-XII intact, normal strength, sensation and reflexes    throughout       Assessment:     1. Initial Medicare annual wellness visit   2. Chronic obstructive pulmonary disease, unspecified COPD type (HCC) - prn albuterol  3. Tobacco use disorder - agrees to CT - decide what time after f/u CXR in 4-6 wks  4. Prediabetes - reviewed diet changes in detail - pt drinking a lot of sweet tea  5. Screening for hyperlipidemia   6. Abnormal serum level of alkaline phosphatase - strongly encouraged dexa. Increase calcium intake and vit D supp.  7. Overweight (BMI 25.0-29.9)   8. Routine screening for STI (sexually transmitted infection)   9. Disease of bone   10. Screening for colon cancer - cologuard  11. Estrogen deficiency   12. Screening for breast cancer   13.    Community acquired pneumonia of right lower lobe of lung - start levaquin - failed zpack. CXR findings last wk with possible developing infection with right lung base consolidation and small pleural effusion. Pt's acute illness has sig worsened since last week and the severity of pain in her right mid back makes  me very  concerned for progressive infection.  14.    New abnormality on chest xray - pt is acutely ill - I am worried that if we proceed with a CT scan now, a possible infection may mask an underlying nodule. Will wait for 4-6 weeks after infection is cleared than repeat CXR.  If nodule is still visible -> needs chest CT with contrast. If nodule and effusion have both cleared, than will refer for low dose lung cancer screening CT as pt is very concerned about the cost of medical care and VERY reluctant to agree to interventions. However, pt and her sister both feel agree to proceed as stated. 15.    Status post splenectomy - importance of receiving prevnar-13 and pneumovax-23 1 yr later reinforced to pt - however, due to her acute respiratory illness today will defer to next OV for repeat CXR in 4-6 wks.  16.   Moderate episode of recurrent major depressive disorder - has not been on any meds prior but willing to try "something mild". Start celexa. Recheck in 4-6 wks.   Plan:     During the course of the visit the patient was educated and counseled about appropriate screening and preventive services including:    Pneumococcal vaccine   Influenza vaccine  Screening electrocardiogram  Screening mammography  Screening Pap smear and pelvic exam - s/p hysterectomy   Bone densitometry screening  Colorectal cancer screening  Diabetes screening  Nutrition counseling   Smoking cessation counseling Advanced directives: has NO advanced directive  - add't info requested and handout given    Patient Instructions (the written plan) was given to the patient.  Medicare Attestation I have personally reviewed: The patient's medical and social history Their use of alcohol, tobacco or illicit drugs Their current medications and supplements The patient's functional ability including ADLs,fall risks, home safety risks, cognitive, and hearing and visual impairment Diet and physical activities Evidence  for depression or mood disorders  The patient's weight, height, BMI, and visual acuity have been recorded in the chart.  I have made referrals, counseling, and provided education to the patient based on review of the above and I have provided the patient with a written personalized care plan for preventive services.     Shawnee Knapp, MD   01/18/2018    Orders Placed This Encounter  Procedures  . MM Digital Screening    Standing Status:   Future    Standing Expiration Date:   03/19/2019    Order Specific Question:   Reason for Exam (SYMPTOM  OR DIAGNOSIS REQUIRED)    Answer:   screening    Order Specific Question:   Preferred imaging location?    Answer:   [facility]  . DG Bone Density    Standing Status:   Future    Standing Expiration Date:   03/19/2019    Order Specific Question:   Reason for Exam (SYMPTOM  OR DIAGNOSIS REQUIRED)    Answer:   estrogen deficiency, high alkaline phosphotase    Order Specific Question:   Preferred imaging location?    Answer:   [facility]  . Lipid panel    Order Specific Question:   Has the patient fasted?    Answer:   Yes  . TSH  . HCV Ab w/Rflx to Verification  . VITAMIN D 25 Hydroxy (Vit-D Deficiency, Fractures)  . Cologuard  . VITAMIN D 25 Hydroxy (Vit-D Deficiency, Fractures)  . Interpretation:  . POCT urinalysis dipstick  .  POCT CBC    Meds ordered this encounter  Medications  . levofloxacin (LEVAQUIN) 500 MG tablet    Sig: Take 1.5 tablets (750 mg total) by mouth daily.    Dispense:  7 tablet    Refill:  0  . HYDROcodone-acetaminophen (NORCO/VICODIN) 5-325 MG tablet    Sig: Take 1 tablet by mouth every 4 (four) hours as needed for moderate pain.    Dispense:  30 tablet    Refill:  0  . citalopram (CELEXA) 10 MG tablet    Sig: Take 1 tablet (10 mg total) by mouth daily.    Dispense:  90 tablet    Refill:  3  . Dextromethorphan-Guaifenesin (MUCINEX DM MAXIMUM STRENGTH) 60-1200 MG TB12    Sig: Take 1 tablet by mouth every  12 (twelve) hours.    Dispense:  14 each    Refill:  1     Delman Cheadle, M.D.  Primary Care at [facility] [address] [place], [place] [address] [phone] phone [phone] fax  01/19/18 8:34 PM

## 2018-01-18 NOTE — Patient Instructions (Addendum)
IF YOU ARE NOT FEELING BETTER IN 1 WEEK, PLEASE COME BACK IN - YOU MIGHT NEED MORE ANTIBIOTIC BUT WE NEED TO EXAMINE YOU AND RECHECK YOUR CHEST XRAY FIRST.  If you are definitely feeling better in 1 week, then come back to see me in 4-6 weeks so we can repeat the chest xray to see if that nodule is still there - if it is, then we need to do a detailed chest CT with contrast.  If it isn't, then we will get you set up to get the free low-radiation screening lung CT scan that your insurance pays for once a year (as long as you meet with the nurse to get info on smoking cessation prior.)  You need to schedule your mammogram and your DEXA bone scan.  Please call [facility] at [phone] to schedule.  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  Every one is supposed to get one pneumonia vaccine booster (as you received the full series when you were a baby) at 67 yo and a second a 67 yo - then you don't need any more your whole life. However, we can't give these to you today since you are ill and going on an antibiotic so we will likely want to do this at your next visit. It protects against the bacteria Streptococcal pneumonia that is the most likely to kill you if you catch it and you are at VERY high risk due to smoking and the lack of the spleen.  IF you received an x-ray today, you will receive an invoice from [facility]. Please contact [facility] at [phone] with questions or concerns regarding your invoice.   IF you received labwork today, you will receive an invoice from [facility]. Please contact [facility] at [phone] with questions or concerns regarding your invoice.   Our billing staff will not be able to assist you with questions regarding bills from these companies.  You will be contacted with the lab results as soon as they are available. The fastest way to get your results is to activate  your My Chart account. Instructions are located on the last page of this paperwork. If you have not heard from [place] regarding the results in 2 weeks, please contact this office.     Calcium Intake Recommendations Calcium is a mineral that affects many functions in the body, including:  Blood clotting.  Blood vessel function.  Nerve impulse conduction.  Hormone secretion.  Muscle contraction.  Bone and teeth functions.  Most of your body's calcium supply is stored in your bones and teeth. When your calcium stores are low, you may be at risk for low bone mass, bone loss, and bone fractures. Consuming enough calcium helps to grow healthy bones and teeth and to prevent breakdown over time. It is very important that you get enough calcium if you are:  A child undergoing rapid growth.  An adolescent girl.  A pre- or post-menopausal woman.  A woman whose menstrual cycle has stopped due to anorexia nervosa or regular intense exercise.  An individual with lactose intolerance or a milk allergy.  A vegetarian.  What is my plan? Try to consume the recommended amount of calcium daily based on your age. Depending on your overall health, your health care provider may recommend increased calcium intake.General daily calcium intake recommendations by age are:  Birth to 6 months: 200 mg.  Infants 7 to 12 months:  260 mg.  Children 1 to 3 years: 700 mg.  Children 4 to 8 years: 1,000 mg.  Children 9 to 13 years: 1,300 mg.  Teens 14 to 18 years: 1,300 mg.  Adults 19 to 50 years: 1,000 mg.  Adult women 51 to 70 years: 1,200 mg.  Adult men 51 to 70 years: 1,000 mg.  Adults 71 years and older: 1,200 mg.  Pregnant and breastfeeding teens: 1,300 mg.  Pregnant and breastfeeding adults: 1,000 mg.  What do I need to know about calcium intake?  In order for the body to absorb calcium, it needs vitamin D. You can get vitamin D through: ? Direct exposure of the skin to  sunlight. ? Foods, such as egg yolks, liver, saltwater fish, and fortified milk. ? Supplements.  Consuming too much calcium may cause: ? Constipation. ? Decreased absorption of iron and zinc. ? Kidney stones.  Calcium supplements may interact with certain medicines. Check with your health care provider before starting any calcium supplements.  Try to get most of your calcium from food. What foods can I eat? Grains  Fortified oatmeal. Fortified ready-to-eat cereals. Fortified frozen waffles. Vegetables Turnip greens. Broccoli. Fruits Fortified orange juice. Meats and Other Protein Sources Canned sardines with bones. Canned salmon with bones. Soy beans. Tofu. Baked beans. Almonds. [place] nuts. Sunflower seeds. Dairy Milk. Yogurt. Cheese. Cottage cheese. Beverages Fortified soy milk. Fortified rice milk. Sweets/Desserts Pudding. Ice Cream. Milkshakes. Blackstrap molasses. The items listed above may not be a complete list of recommended foods or beverages. Contact your dietitian for more options. What foods can affect my calcium intake? It may be more difficult for your body to use calcium or calcium may leave your body more quickly if you consume large amounts of:  Sodium.  Protein.  Caffeine.  Alcohol.  This information is not intended to replace advice given to you by your health care provider. Make sure you discuss any questions you have with your health care provider. Document Released: 07/24/2004 Document Revised: 06/29/2016 Document Reviewed: 05/18/2014 Elsevier Interactive Patient Education  2018 [place] Prediabetes-also called impaired glucose tolerance or impaired fasting glucose-is a condition that causes blood sugar (blood glucose) levels to be higher than normal. Following a healthy diet can help to keep prediabetes under control. It can also help to lower the risk of type 2 diabetes and heart disease, which are increased in people  who have prediabetes. Along with regular exercise, a healthy diet:  Promotes weight loss.  Helps to control blood sugar levels.  Helps to improve the way that the body uses insulin.  What do I need to know about this eating plan?  Use the glycemic index (GI) to plan your meals. The index tells you how quickly a food will raise your blood sugar. Choose low-GI foods. These foods take a longer time to raise blood sugar.  Pay close attention to the amount of carbohydrates in the food that you eat. Carbohydrates increase blood sugar levels.  Keep track of how many calories you take in. Eating the right amount of calories will help you to achieve a healthy weight. Losing about 7 percent of your starting weight can help to prevent type 2 diabetes.  You may want to follow a Mediterranean diet. This diet includes a lot of vegetables, lean meats or fish, whole grains, fruits, and healthy oils and fats. What foods can I eat? Grains Whole grains, such as whole-wheat or whole-grain breads, crackers, cereals, and pasta. Unsweetened  oatmeal. Bulgur. Barley. Quinoa. Brown rice. Corn or whole-wheat flour tortillas or taco shells. Vegetables Lettuce. Spinach. Peas. Beets. Cauliflower. Cabbage. Broccoli. Carrots. Tomatoes. Squash. Eggplant. Herbs. Peppers. Onions. Cucumbers. Brussels sprouts. Fruits Berries. Bananas. Apples. Oranges. Grapes. Papaya. Mango. Pomegranate. Kiwi. Grapefruit. Cherries. Meats and Other Protein Sources Seafood. Lean meats, such as chicken and [place] or lean cuts of pork and beef. Tofu. Eggs. Nuts. Beans. Dairy Low-fat or fat-free dairy products, such as yogurt, cottage cheese, and cheese. Beverages Water. Tea. Coffee. Sugar-free or diet soda. Seltzer water. Milk. Milk alternatives, such as soy or almond milk. Condiments Mustard. Relish. Low-fat, low-sugar ketchup. Low-fat, low-sugar barbecue sauce. Low-fat or fat-free mayonnaise. Sweets and Desserts Sugar-free or low-fat  pudding. Sugar-free or low-fat ice cream and other frozen treats. Fats and Oils Avocado. Walnuts. Olive oil. The items listed above may not be a complete list of recommended foods or beverages. Contact your dietitian for more options. What foods are not recommended? Grains Refined white flour and flour products, such as bread, pasta, snack foods, and cereals. Beverages Sweetened drinks, such as sweet iced tea and soda. Sweets and Desserts Baked goods, such as cake, cupcakes, pastries, cookies, and cheesecake. The items listed above may not be a complete list of foods and beverages to avoid. Contact your dietitian for more information. This information is not intended to replace advice given to you by your health care provider. Make sure you discuss any questions you have with your health care provider. Document Released: 04/26/2015 Document Revised: 05/17/2016 Document Reviewed: 01/05/2015 Elsevier Interactive Patient Education  2017 Elsevier Inc.  Prediabetes Prediabetes is the condition of having a blood sugar (blood glucose) level that is higher than it should be, but not high enough for you to be diagnosed with type 2 diabetes. Having prediabetes puts you at risk for developing type 2 diabetes (type 2 diabetes mellitus). Prediabetes may be called impaired glucose tolerance or impaired fasting glucose. Prediabetes usually does not cause symptoms. Your health care provider can diagnose this condition with blood tests. You may be tested for prediabetes if you are overweight and if you have at least one other risk factor for prediabetes. Risk factors for prediabetes include:  Having a family member with type 2 diabetes.  Being overweight or obese.  Being older than age 92.  Being of American-Indian, African-American, Hispanic/Latino, or Asian/Pacific Islander descent.  Having an inactive (sedentary) lifestyle.  Having a history of gestational diabetes or polycystic ovarian syndrome  (PCOS).  Having low levels of good cholesterol (HDL-C) or high levels of blood fats (triglycerides).  Having high blood pressure.  What is blood glucose and how is blood glucose measured?  Blood glucose refers to the amount of glucose in your bloodstream. Glucose comes from eating foods that contain sugars and starches (carbohydrates) that the body breaks down into glucose. Your blood glucose level may be measured in mg/dL (milligrams per deciliter) or mmol/L (millimoles per liter).Your blood glucose may be checked with one or more of the following blood tests:  A fasting blood glucose (FBG) test. You will not be allowed to eat (you will fast) for at least 8 hours before a blood sample is taken. ? A normal range for FBG is 70-100 mg/dl (3.9-5.6 mmol/L).  An A1c (hemoglobin A1c) blood test. This test provides information about blood glucose control over the previous 2?83months.  An oral glucose tolerance test (OGTT). This test measures your blood glucose twice: ? After fasting. This is your baseline level. ? Two hours after you  drink a beverage that contains glucose.  You may be diagnosed with prediabetes:  If your FBG is 100?125 mg/dL (5.6-6.9 mmol/L).  If your A1c level is 5.7?6.4%.  If your OGGT result is 140?199 mg/dL (7.8-11 mmol/L).  These blood tests may be repeated to confirm your diagnosis. What happens if blood glucose is too high? The pancreas produces a hormone (insulin) that helps move glucose from the bloodstream into cells. When cells in the body do not respond properly to insulin that the body makes (insulin resistance), excess glucose builds up in the blood instead of going into cells. As a result, high blood glucose (hyperglycemia) can develop, which can cause many complications. This is a symptom of prediabetes. What can happen if blood glucose stays higher than normal for a long time? Having high blood glucose for a long time is dangerous. Too much glucose in your  blood can damage your nerves and blood vessels. Long-term damage can lead to complications from diabetes, which may include:  Heart disease.  Stroke.  Blindness.  Kidney disease.  Depression.  Poor circulation in the feet and legs, which could lead to surgical removal (amputation) in severe cases.  How can prediabetes be prevented from turning into type 2 diabetes?  To help prevent type 2 diabetes, take the following actions:  Be physically active. ? Do moderate-intensity physical activity for at least 30 minutes on at least 5 days of the week, or as much as told by your health care provider. This could be brisk walking, biking, or water aerobics. ? Ask your health care provider what activities are safe for you. A mix of physical activities may be best, such as walking, swimming, cycling, and strength training.  Lose weight as told by your health care provider. ? Losing 5-7% of your body weight can reverse insulin resistance. ? Your health care provider can determine how much weight loss is best for you and can help you lose weight safely.  Follow a healthy meal plan. This includes eating lean proteins, complex carbohydrates, fresh fruits and vegetables, low-fat dairy products, and healthy fats. ? Follow instructions from your health care provider about eating or drinking restrictions. ? Make an appointment to see a diet and nutrition specialist (registered dietitian) to help you create a healthy eating plan that is right for you.  Do not smoke or use any tobacco products, such as cigarettes, chewing tobacco, and e-cigarettes. If you need help quitting, ask your health care provider.  Take over-the-counter and prescription medicines as told by your health care provider. You may be prescribed medicines that help lower the risk of type 2 diabetes.  This information is not intended to replace advice given to you by your health care provider. Make sure you discuss any questions you have  with your health care provider. Document Released: 04/02/2016 Document Revised: 05/17/2016 Document Reviewed: 01/31/2016 Elsevier Interactive Patient Education  2018 [place] protect organs, store calcium, and anchor muscles. Good health habits, such as eating nutritious foods and exercising regularly, are important for maintaining healthy bones. They can also help to prevent a condition that causes bones to lose density and become weak and brittle (osteoporosis). Why is bone mass important? Bone mass refers to the amount of bone tissue that you have. The higher your bone mass, the stronger your bones. An important step toward having healthy bones throughout life is to have strong and dense bones during childhood. A young adult who has a high bone mass  is more likely to have a high bone mass later in life. Bone mass at its greatest it is called peak bone mass. A large decline in bone mass occurs in older adults. In women, it occurs about the time of menopause. During this time, it is important to practice good health habits, because if more bone is lost than what is replaced, the bones will become less healthy and more likely to break (fracture). If you find that you have a low bone mass, you may be able to prevent osteoporosis or further bone loss by changing your diet and lifestyle. How can I find out if my bone mass is low? Bone mass can be measured with an X-ray test that is called a bone mineral density (BMD) test. This test is recommended for all women who are age 29 or older. It may also be recommended for men who are age 62 or older, or for people who are more likely to develop osteoporosis due to:  Having bones that break easily.  Having a long-term disease that weakens bones, such as kidney disease or rheumatoid arthritis.  Having menopause earlier than normal.  Taking medicine that weakens bones, such as steroids, thyroid hormones, or hormone treatment for breast  cancer or prostate cancer.  Smoking.  Drinking three or more alcoholic drinks each day.  What are the nutritional recommendations for healthy bones? To have healthy bones, you need to get enough of the right minerals and vitamins. Most nutrition experts recommend getting these nutrients from the foods that you eat. Nutritional recommendations vary from person to person. Ask your health care provider what is healthy for you. Here are some general guidelines. Calcium Recommendations Calcium is the most important (essential) mineral for bone health. Most people can get enough calcium from their diet, but supplements may be recommended for people who are at risk for osteoporosis. Good sources of calcium include:  Dairy products, such as low-fat or nonfat milk, cheese, and yogurt.  Dark green leafy vegetables, such as bok choy and broccoli.  Calcium-fortified foods, such as orange juice, cereal, bread, soy beverages, and tofu products.  Nuts, such as almonds.  Follow these recommended amounts for daily calcium intake:  Children, age 549?3: 700 mg.  Children, age 54?8: 1,000 mg.  Children, age 56?13: 1,300 mg.  Teens, age 83?18: 1,300 mg.  Adults, age 51?50: 1,000 mg.  Adults, age 58?70: ? Men: 1,000 mg. ? Women: 1,200 mg.  Adults, age 66 or older: 1,200 mg.  Pregnant and breastfeeding females: ? Teens: 1,300 mg. ? Adults: 1,000 mg.  Vitamin D Recommendations Vitamin D is the most essential vitamin for bone health. It helps the body to absorb calcium. Sunlight stimulates the skin to make vitamin D, so be sure to get enough sunlight. If you live in a cold climate or you do not get outside often, your health care provider may recommend that you take vitamin D supplements. Good sources of vitamin D in your diet include:  Egg yolks.  Saltwater fish.  Milk and cereal fortified with vitamin D.  Follow these recommended amounts for daily vitamin D intake:  Children and teens, age  55?18: 48 international units.  Adults, age 72 or younger: 400-800 international units.  Adults, age 51 or older: 800-1,000 international units.  Other Nutrients Other nutrients for bone health include:  Phosphorus. This mineral is found in meat, poultry, dairy foods, nuts, and legumes. The recommended daily intake for adult men and adult women is 700 mg.  Magnesium. This mineral is found in seeds, nuts, dark green vegetables, and legumes. The recommended daily intake for adult men is 400?420 mg. For adult women, it is 310?320 mg.  Vitamin K. This vitamin is found in green leafy vegetables. The recommended daily intake is 120 mg for adult men and 90 mg for adult women.  What type of physical activity is best for building and maintaining healthy bones? Weight-bearing and strength-building activities are important for building and maintaining peak bone mass. Weight-bearing activities cause muscles and bones to work against gravity. Strength-building activities increases muscle strength that supports bones. Weight-bearing and muscle-building activities include:  Walking and hiking.  Jogging and running.  Dancing.  Gym exercises.  Lifting weights.  Tennis and racquetball.  Climbing stairs.  Aerobics.  Adults should get at least 30 minutes of moderate physical activity on most days. Children should get at least 60 minutes of moderate physical activity on most days. Ask your health care provide what type of exercise is best for you. Where can I find more information? For more information, check out the following websites:  [place]: [employer].se  [employer] of Health: http://www.niams.AnonymousEar.fr.asp  This information is not intended to replace advice given to you by your health care provider. Make sure you discuss any questions you have with your health care provider. Document Released:  03/01/2004 Document Revised: 06/29/2016 Document Reviewed: 12/15/2014 Elsevier Interactive Patient Education  [employer].

## 2018-01-19 ENCOUNTER — Encounter: Payer: Self-pay | Admitting: Family Medicine

## 2018-01-19 DIAGNOSIS — F331 Major depressive disorder, recurrent, moderate: Secondary | ICD-10-CM | POA: Insufficient documentation

## 2018-01-19 DIAGNOSIS — Z9081 Acquired absence of spleen: Secondary | ICD-10-CM | POA: Insufficient documentation

## 2018-01-19 DIAGNOSIS — R7303 Prediabetes: Secondary | ICD-10-CM | POA: Insufficient documentation

## 2018-01-20 LAB — LIPID PANEL
CHOL/HDL RATIO: 3.1 ratio (ref 0.0–4.4)
CHOLESTEROL TOTAL: 181 mg/dL (ref 100–199)
HDL: 58 mg/dL (ref >39)
LDL CALC: 101 mg/dL — AB (ref 0–99)
TRIGLYCERIDES: 111 mg/dL (ref 0–149)
VLDL Cholesterol Cal: 22 mg/dL (ref 5–40)

## 2018-01-20 LAB — VITAMIN D 25 HYDROXY (VIT D DEFICIENCY, FRACTURES): VIT D 25 HYDROXY: 34.5 ng/mL (ref 30.0–100.0)

## 2018-01-20 LAB — HCV AB W/RFLX TO VERIFICATION: HCV Ab: <0.1 s/co ratio (ref 0.0–0.9)

## 2018-01-20 LAB — HCV INTERPRETATION

## 2018-01-20 LAB — TSH: TSH: 1.37 u[IU]/mL (ref 0.450–4.500)

## 2018-01-20 NOTE — Progress Notes (Signed)
LETTER SENT 01/20/18

## 2018-01-27 DIAGNOSIS — Z1211 Encounter for screening for malignant neoplasm of colon: Secondary | ICD-10-CM | POA: Diagnosis not present

## 2018-01-27 DIAGNOSIS — Z1212 Encounter for screening for malignant neoplasm of rectum: Secondary | ICD-10-CM | POA: Diagnosis not present

## 2018-02-05 LAB — COLOGUARD: COLOGUARD: POSITIVE

## 2018-02-06 ENCOUNTER — Encounter: Payer: Self-pay | Admitting: Gastroenterology

## 2018-02-06 ENCOUNTER — Telehealth: Payer: Self-pay | Admitting: Family Medicine

## 2018-02-06 DIAGNOSIS — R195 Other fecal abnormalities: Secondary | ICD-10-CM

## 2018-02-06 NOTE — Telephone Encounter (Signed)
Phone message sent to Dr. Brigitte Pulse.  Positive Cologuard results received - not released to pt.  High Priority message sent to Dr. Brigitte Pulse to advise.

## 2018-02-06 NOTE — Telephone Encounter (Signed)
Called pt and informed her of + result. She very reluctantly agreed to GI referral w/ initial OV visit to discuss her risks/options but might refuse colonoscopy as she doesn't want to drink the prep. Referral placed  Reminded pt to sched f/u OV with me w/in 1 mo to repeat CXR to ensure prior abnml/effusion has resolved - she remembered and agrees. States her pna/acute illness/chest congestion has resolved but she is still having left flank/side/rib pain. She was taking the hydrocodone which made her very constipated - bowels now starting to move again but not yet back to prior baseline. When I advised pt that continued pain could be concerning for persistent effusion or irritation from lung abnml she stated that pain had completely resolved for several days until she did a lot of bending then it was retriggered so she is confident it is MSK.

## 2018-02-06 NOTE — Telephone Encounter (Signed)
Copied from [place] [phone]. Topic: General - Other >> Feb 06, 2018 11:30 AM Lolita Rieger, RMA wrote: Reason for CRM: Anne Ng from [employer] called wanting to verify receipt of positive cologuard results please call  [phone] if they were not received

## 2018-03-01 ENCOUNTER — Ambulatory Visit (INDEPENDENT_AMBULATORY_CARE_PROVIDER_SITE_OTHER): Payer: Medicare Other

## 2018-03-01 ENCOUNTER — Other Ambulatory Visit: Payer: Self-pay

## 2018-03-01 ENCOUNTER — Encounter: Payer: Self-pay | Admitting: Family Medicine

## 2018-03-01 ENCOUNTER — Ambulatory Visit (INDEPENDENT_AMBULATORY_CARE_PROVIDER_SITE_OTHER): Payer: Medicare Other | Admitting: Family Medicine

## 2018-03-01 VITALS — BP 130/82 | HR 93 | Temp 98.1°F | Resp 16 | Ht 62.6 in | Wt 168.0 lb

## 2018-03-01 DIAGNOSIS — B351 Tinea unguium: Secondary | ICD-10-CM | POA: Diagnosis not present

## 2018-03-01 DIAGNOSIS — R109 Unspecified abdominal pain: Secondary | ICD-10-CM | POA: Diagnosis not present

## 2018-03-01 DIAGNOSIS — M79671 Pain in right foot: Secondary | ICD-10-CM | POA: Diagnosis not present

## 2018-03-01 DIAGNOSIS — Z23 Encounter for immunization: Secondary | ICD-10-CM

## 2018-03-01 DIAGNOSIS — R911 Solitary pulmonary nodule: Secondary | ICD-10-CM | POA: Diagnosis not present

## 2018-03-01 DIAGNOSIS — I872 Venous insufficiency (chronic) (peripheral): Secondary | ICD-10-CM

## 2018-03-01 DIAGNOSIS — G8929 Other chronic pain: Secondary | ICD-10-CM | POA: Diagnosis not present

## 2018-03-01 DIAGNOSIS — F17200 Nicotine dependence, unspecified, uncomplicated: Secondary | ICD-10-CM

## 2018-03-01 DIAGNOSIS — J449 Chronic obstructive pulmonary disease, unspecified: Secondary | ICD-10-CM

## 2018-03-01 LAB — POCT URINALYSIS DIP (MANUAL ENTRY)
BILIRUBIN UA: NEGATIVE
Glucose, UA: NEGATIVE mg/dL
Ketones, POC UA: NEGATIVE mg/dL
NITRITE UA: NEGATIVE
PH UA: 6 (ref 5.0–8.0)
PROTEIN UA: NEGATIVE mg/dL
Spec Grav, UA: <=1.005 — AB (ref 1.010–1.025)
Urobilinogen, UA: 0.2 E.U./dL

## 2018-03-01 LAB — POCT CBC
Granulocyte percent: 58.3 %G (ref 37–80)
HCT, POC: 41.6 % (ref 37.7–47.9)
Hemoglobin: 13.2 g/dL (ref 12.2–16.2)
LYMPH, POC: 2.8 (ref 0.6–3.4)
MCH, POC: 28.3 pg (ref 27–31.2)
MCHC: 31.8 g/dL (ref 31.8–35.4)
MCV: 88.9 fL (ref 80–97)
MID (cbc): 0.4 (ref 0–0.9)
MPV: 7.8 fL (ref 0–99.8)
POC Granulocyte: 4.5 (ref 2–6.9)
POC LYMPH PERCENT: 36.1 %L (ref 10–50)
POC MID %: 5.6 % (ref 0–12)
Platelet Count, POC: 353 10*3/uL (ref 142–424)
RBC: 4.68 M/uL (ref 4.04–5.48)
RDW, POC: 14.7 %
WBC: 7.8 10*3/uL (ref 4.6–10.2)

## 2018-03-01 LAB — POC MICROSCOPIC URINALYSIS (UMFC): Mucus: ABSENT

## 2018-03-01 MED ORDER — LEVALBUTEROL TARTRATE 45 MCG/ACT IN AERO
1.0000 | INHALATION_SPRAY | RESPIRATORY_TRACT | 12 refills | Status: DC | PRN
Start: 2018-03-01 — End: 2018-03-11

## 2018-03-01 MED ORDER — FUROSEMIDE 20 MG PO TABS: 20.0000 mg | ORAL_TABLET | Freq: Every day | ORAL | 3 refills | Status: AC | PRN

## 2018-03-01 NOTE — Patient Instructions (Addendum)
IF you received an x-ray today, you will receive an invoice from [facility]. Please contact [facility] at [phone] with questions or concerns regarding your invoice.   IF you received labwork today, you will receive an invoice from [place]. Please contact [facility] at [phone] with questions or concerns regarding your invoice.   Our billing staff will not be able to assist you with questions regarding bills from these companies.  You will be contacted with the lab results as soon as they are available. The fastest way to get your results is to activate your My Chart account. Instructions are located on the last page of this paperwork. If you have not heard from [place] regarding the results in 2 weeks, please contact this office.      Chronic Venous Insufficiency Chronic venous insufficiency, also called venous stasis, is a condition that prevents blood from being pumped effectively through the veins in your legs. Blood may no longer be pumped effectively from the legs back to the heart. This condition can range from mild to severe. With proper treatment, you should be able to continue with an active life. What are the causes? Chronic venous insufficiency occurs when the vein walls become stretched, weakened, or damaged, or when valves within the vein are damaged. Some common causes of this include:  High blood pressure inside the veins (venous hypertension).  Increased blood pressure in the leg veins from long periods of sitting or standing.  A blood clot that blocks blood flow in a vein (deep vein thrombosis, DVT).  Inflammation of a vein (phlebitis) that causes a blood clot to form.  Tumors in the pelvis that cause blood to back up.  What increases the risk? The following factors may make you more likely to develop this condition:  Having a family history of this condition.  Obesity.  Pregnancy.  Living without enough physical activity or exercise  (sedentary lifestyle).  Smoking.  Having a job that requires long periods of standing or sitting in one place.  Being a certain age. Women in their 76s and 82s and men in their 36s are more likely to develop this condition.  What are the signs or symptoms? Symptoms of this condition include:  Veins that are enlarged, bulging, or twisted (varicose veins).  Skin breakdown or ulcers.  Reddened or discolored skin on the front of the leg.  Brown, smooth, tight, and painful skin just above the ankle, usually on the inside of the leg (lipodermatosclerosis).  Swelling.  How is this diagnosed? This condition may be diagnosed based on:  Your medical history.  A physical exam.  Tests, such as: ? A procedure that creates an image of a blood vessel and nearby organs and provides information about blood flow through the blood vessel (duplex ultrasound). ? A procedure that tests blood flow (plethysmography). ? A procedure to look at the veins using X-ray and dye (venogram).  How is this treated? The goals of treatment are to help you return to an active life and to minimize pain or disability. Treatment depends on the severity of your condition, and it may include:  Wearing compression stockings. These can help relieve symptoms and help prevent your condition from getting worse. However, they do not cure the condition.  Sclerotherapy. This is a procedure involving an injection of a material that "dissolves" damaged veins.  Surgery. This may involve: ? Removing a diseased vein (vein stripping). ? Cutting off blood flow through the vein (laser ablation surgery). ?  Repairing a valve.  Follow these instructions at home:  Wear compression stockings as told by your health care provider. These stockings help to prevent blood clots and reduce swelling in your legs.  Take over-the-counter and prescription medicines only as told by your health care provider.  Stay active by exercising,  walking, or doing different activities. Ask your health care provider what activities are safe for you and how much exercise you need.  Drink enough fluid to keep your urine clear or pale yellow.  Do not use any products that contain nicotine or tobacco, such as cigarettes and e-cigarettes. If you need help quitting, ask your health care provider.  Keep all follow-up visits as told by your health care provider. This is important. Contact a health care provider if:  You have redness, swelling, or more pain in the affected area.  You see a red streak or line that extends up or down from the affected area.  You have skin breakdown or a loss of skin in the affected area, even if the breakdown is small.  You get an injury in the affected area. Get help right away if:  You get an injury and an open wound in the affected area.  You have severe pain that does not get better with medicine.  You have sudden numbness or weakness in the foot or ankle below the affected area, or you have trouble moving your foot or ankle.  You have a fever and you have worse or persistent symptoms.  You have chest pain.  You have shortness of breath. Summary  Chronic venous insufficiency, also called venous stasis, is a condition that prevents blood from being pumped effectively through the veins in your legs.  Chronic venous insufficiency occurs when the vein walls become stretched, weakened, or damaged, or when valves within the vein are damaged.  Treatment for this condition depends on how severe your condition is, and it may involve wearing compression stockings or having a procedure.  Make sure you stay active by exercising, walking, or doing different activities. Ask your health care provider what activities are safe for you and how much exercise you need. This information is not intended to replace advice given to you by your health care provider. Make sure you discuss any questions you have with your  health care provider. Document Released: 04/15/2007 Document Revised: 10/29/2016 Document Reviewed: 10/29/2016 Elsevier Interactive Patient Education  2017 [employer].

## 2018-03-01 NOTE — Progress Notes (Signed)
Subjective:  By signing my name below, I, Samantha Krueger, attest that this documentation has been prepared under the direction and in the presence of Delman Cheadle, MD. Electronically Signed: Moises Krueger, [place]. 03/01/2018 , 4:09 PM .  Patient was seen in Room 1 .   Patient ID: Samantha Krueger, female    DOB: 01/07/1951, 67 y.o.   MRN: [id number] Chief Complaint  Patient presents with  . Chronic Conditions    follow-up    HPI [name] is a 67 y.o. female who presents to Primary Care at [facility] for follow up. Patient has colonoscopy scheduled for April 2nd. She also has a pre-visit with nurse scheduled for March 19th. Family history of colon cancer in uncle, and sister with several polyps on colonoscopy.   Patient states she started Celexa but stopped it because she didn't feel any differences. She used to be on 40mg  in the past.   She also reports stopping her albuterol inhaler, because it made her chest tighter, and hurt. She describes sensation feeling like her chest is going to burst open. After stopping her inhaler, her chest felt much better and her cough has improved, but still productive. She denies feeling sick, but her lower ribs have been sore bilaterally. She had to cancel her CT scan due to jury duty.   She mentions hurting her right foot in 2008, and crushed her right heel. She denies any recent injuries. She also informs redness over her bilateral lower extremities, which started about a few months ago. She also notes her right foot becomes swollen. She denies wearing compression socks. She denies having any improvement while on antibiotics, but she also didn't really checked. She states she's had varicose veins for several years; family history of varicose veins in her mother. She requests handicap placard.   She also mentions having urinary frequency.   She was brought in by her sister, Samantha Krueger, today. Patient notes taking care of several dogs at home. She is still  smoking.   Past Medical History:  Diagnosis Date  . Allergy   . Anemia   . Anxiety   . Arthritis   . Depression   . Emphysema of lung ([place])   . GERD (gastroesophageal reflux disease)   . Hypertension   . Nephrolithiasis    Past Surgical History:  Procedure Laterality Date  . ABDOMINAL HYSTERECTOMY    . APPENDECTOMY    . CESAREAN SECTION    . CHOLECYSTECTOMY    . HERNIA REPAIR    . SPLENECTOMY  1965   Prior to Admission medications   Medication Sig Start Date End Date Taking? Authorizing Provider  albuterol (PROVENTIL HFA;VENTOLIN HFA) 108 (90 Base) MCG/ACT inhaler Inhale 2 puffs into the lungs every 4 (four) hours as needed for wheezing or shortness of breath (or cough). 01/11/18   Shawnee Knapp, MD  citalopram (CELEXA) 10 MG tablet Take 1 tablet (10 mg total) by mouth daily. 01/18/18   Shawnee Knapp, MD  Dextromethorphan-Guaifenesin (MUCINEX DM MAXIMUM STRENGTH) 60-1200 MG TB12 Take 1 tablet by mouth every 12 (twelve) hours. 01/18/18   Shawnee Knapp, MD  HYDROcodone-acetaminophen (NORCO/VICODIN) 5-325 MG tablet Take 1 tablet by mouth every 4 (four) hours as needed for moderate pain. 01/18/18   Shawnee Knapp, MD  levofloxacin (LEVAQUIN) 500 MG tablet Take 1.5 tablets (750 mg total) by mouth daily. 01/18/18   Shawnee Knapp, MD   Allergies  Allergen Reactions  . Albuterol Sulfate  Pt states it makes her chest tight and she feels pain   Family History  Problem Relation Age of Onset  . Hyperlipidemia Mother   . Heart disease Mother   . Hypertension Mother   . Stroke Mother   . Heart disease Father   . Hypertension Sister   . Diabetes Maternal Grandmother    Social History   Socioeconomic History  . Marital status: Divorced    Spouse name: None  . Number of children: None  . Years of education: None  . Highest education level: None  Social Needs  . Financial resource strain: None  . Food insecurity - worry: None  . Food insecurity - inability: None  . Transportation needs -  medical: None  . Transportation needs - non-medical: None  Occupational History  . None  Tobacco Use  . Smoking status: Current Every Day Smoker    Packs/day: 0.50    Years: 48.00    Pack years: 24.00  . Smokeless tobacco: Never Used  Substance and Sexual Activity  . Alcohol use: No  . Drug use: No  . Sexual activity: None  Other Topics Concern  . None  Social History Narrative  . None   Depression screen [facility] 2/9 03/01/2018 01/18/2018 01/11/2018 01/09/2015  Decreased Interest 0 0 0 1  Down, Depressed, Hopeless 0 0 0 3  PHQ - 2 Score 0 0 0 4  Altered sleeping - - - 3  Tired, decreased energy - - - 3  Feeling bad or failure about yourself  - - - 1  Trouble concentrating - - - 1  Moving slowly or fidgety/restless - - - 1  Suicidal thoughts - - - 1  PHQ-9 Score - - - 14    Review of Systems  Constitutional: Negative for chills, fatigue, fever and unexpected weight change.  Respiratory: Positive for cough.   Cardiovascular: Positive for leg swelling.  Gastrointestinal: Negative for constipation, diarrhea, nausea and vomiting.  Genitourinary: Positive for flank pain and frequency.  Musculoskeletal: Positive for myalgias.  Skin: Negative for rash and wound.  Neurological: Negative for dizziness, weakness and headaches.       Objective:   Physical Exam  Constitutional: She is oriented to person, place, and time. She appears well-developed and well-nourished. No distress.  HENT:  Head: Normocephalic and atraumatic.  Eyes: EOM are normal. Pupils are equal, round, and reactive to light.  Neck: Neck supple.  Cardiovascular: Normal rate.  Pulses:      Dorsalis pedis pulses are 2+ on the right side.       Posterior tibial pulses are 2+ on the right side.  Pulmonary/Chest: Effort normal. No respiratory distress.  Abdominal: There is CVA tenderness (left).  Musculoskeletal: Normal range of motion.  2+ pitting edema, venous stasis changes from back third foot of ankle to 2/3 of  calf bilaterally  Neurological: She is alert and oriented to person, place, and time.  Skin: Skin is warm and dry.  Psychiatric: She has a normal mood and affect. Her behavior is normal.  Nursing note and vitals reviewed.   BP 130/82   Pulse 93   Temp 98.1 F (36.7 C) (Oral)   Resp 16   Ht 5' 2.6" (1.59 m)   Wt 168 lb (76.2 kg)   SpO2 95%   BMI 30.14 kg/m   Results for orders placed or performed in visit on 03/01/18  POCT CBC  Result Value Ref Range   WBC 7.8 4.6 - 10.2 K/uL  Lymph, poc 2.8 0.6 - 3.4   POC LYMPH PERCENT 36.1 10 - 50 %L   MID (cbc) 0.4 0 - 0.9   POC MID % 5.6 0 - 12 %M   POC Granulocyte 4.5 2 - 6.9   Granulocyte percent 58.3 37 - 80 %G   RBC 4.68 4.04 - 5.48 M/uL   Hemoglobin 13.2 12.2 - 16.2 g/dL   HCT, POC 41.6 37.7 - 47.9 %   MCV 88.9 80 - 97 fL   MCH, POC 28.3 27 - 31.2 pg   MCHC 31.8 31.8 - 35.4 g/dL   RDW, POC 14.7 %   Platelet Count, POC 353 142 - 424 K/uL   MPV 7.8 0 - 99.8 fL  POCT urinalysis dipstick  Result Value Ref Range   Color, UA yellow yellow   Clarity, UA clear clear   Glucose, UA negative negative mg/dL   Bilirubin, UA negative negative   Ketones, POC UA negative negative mg/dL   Spec Grav, UA <=1.005 (A) 1.010 - 1.025   [place], UA trace-intact (A) negative   pH, UA 6.0 5.0 - 8.0   Protein Ur, POC negative negative mg/dL   Urobilinogen, UA 0.2 0.2 or 1.0 E.U./dL   Nitrite, UA Negative Negative   Leukocytes, UA Trace (A) Negative  POCT Microscopic Urinalysis ([facility])  Result Value Ref Range   WBC,UR,HPF,POC Few (A) None WBC/hpf   RBC,UR,HPF,POC None None RBC/hpf   Bacteria None None, Too numerous to count   Mucus Absent Absent   Epithelial Cells, UR Per Microscopy Moderate (A) None, Too numerous to count cells/hpf   Dg Chest 2 View  Result Date: 03/01/2018 CLINICAL DATA:  Recheck possible nodule right lung base EXAM: CHEST - 2 VIEW COMPARISON:  01/11/2018 FINDINGS: Heart and mediastinal contours are within normal limits. No  focal opacities or effusions. No acute bony abnormality. No visible nodules, in particular at the right lung base. IMPRESSION: No active cardiopulmonary disease. Electronically Signed   By: Rolm Baptise M.D.   On: 03/01/2018 15:43       Assessment & Plan:   1. Tobacco use disorder - needs screening lung CT  2. Chronic obstructive pulmonary disease, unspecified COPD type ([place]) - could not tolerate albuterol inhaler - caused sxs of worsening chest tightness/SHoB  3. Chronic foot pain, right   4. Chronic venous stasis dermatitis of both lower extremities - elevated, try compression socks, prn lasix but warned to make effort to get K replacement through diet  5. Flank pain, chronic - unknown etiology  6. Onychomycosis of foot with other complication - not urgent but refer to podiatry    Orders Placed This Encounter  Procedures  . DG Chest 2 View    Standing Status:   Future    Number of Occurrences:   1    Standing Expiration Date:   03/01/2019    Order Specific Question:   Reason for Exam (SYMPTOM  OR DIAGNOSIS REQUIRED)    Answer:   Recheck poss nodule in Right lung base s/p treatment for pneumonia    Order Specific Question:   Preferred imaging location?    Answer:   External  . Pneumococcal conjugate vaccine 13-valent IM  . Ambulatory Referral for Lung Cancer Scre    Referral Priority:   Routine    Referral Type:   Consultation    Referral Reason:   Specialty Services Required    Number of Visits Requested:   1  . Ambulatory referral to Podiatry  Referral Priority:   Routine    Referral Type:   Consultation    Referral Reason:   Specialty Services Required    Requested Specialty:   Podiatry    Number of Visits Requested:   1  . POCT CBC  . POCT urinalysis dipstick  . POCT Microscopic Urinalysis ([facility])    Meds ordered this encounter  Medications  . furosemide (LASIX) 20 MG tablet    Sig: Take 1 tablet (20 mg total) by mouth daily as needed for edema.    Dispense:  30  tablet    Refill:  3  . levalbuterol (XOPENEX HFA) 45 MCG/ACT inhaler    Sig: Inhale 1-2 puffs into the lungs every 4 (four) hours as needed for wheezing or shortness of breath (or cough).    Dispense:  1 Inhaler    Refill:  12    Will need prior auth as she could not tolerate albuterol inhaler due to chest pain/pressure    I personally performed the services described in this documentation, which was scribed in my presence. The recorded information has been reviewed and considered, and addended by me as needed.   Delman Cheadle, M.D.  Primary Care at [facility] [address] [place], [place] [address] [phone] phone [phone] fax  03/17/18 2:50 AM

## 2018-03-05 ENCOUNTER — Other Ambulatory Visit: Payer: Self-pay

## 2018-03-11 ENCOUNTER — Other Ambulatory Visit: Payer: Self-pay

## 2018-03-11 ENCOUNTER — Ambulatory Visit (AMBULATORY_SURGERY_CENTER): Payer: Self-pay

## 2018-03-11 VITALS — Ht 63.0 in | Wt 166.8 lb

## 2018-03-11 DIAGNOSIS — R195 Other fecal abnormalities: Secondary | ICD-10-CM

## 2018-03-11 MED ORDER — NA SULFATE-K SULFATE-MG SULF 17.5-3.13-1.6 GM/177ML PO SOLN: 1.0000 | Freq: Once | ORAL | 0 refills | Status: AC

## 2018-03-11 NOTE — Progress Notes (Signed)
Denies allergies to eggs or soy products. Denies complication of anesthesia or sedation. Denies use of weight loss medication. Denies use of O2.   Emmi instructions declined.  

## 2018-03-17 ENCOUNTER — Telehealth: Payer: Self-pay | Admitting: Gastroenterology

## 2018-03-17 DIAGNOSIS — R0781 Pleurodynia: Secondary | ICD-10-CM | POA: Insufficient documentation

## 2018-03-17 DIAGNOSIS — R0789 Other chest pain: Secondary | ICD-10-CM | POA: Insufficient documentation

## 2018-03-17 NOTE — Telephone Encounter (Signed)
Pt called stating that her prep is $135.00 and she cannot afford. Offered sample- pt informed sample --will be 4th floor desk.  Lot [id number]  exp 01/21  As directed . Pt  Will pick up before Friday   Marie PV

## 2018-03-17 NOTE — Telephone Encounter (Signed)
Pt cannot afford prep. Request an alternative.

## 2018-03-25 ENCOUNTER — Encounter: Payer: Self-pay | Admitting: Gastroenterology

## 2018-03-25 ENCOUNTER — Ambulatory Visit (AMBULATORY_SURGERY_CENTER): Payer: Medicare Other | Admitting: Gastroenterology

## 2018-03-25 VITALS — BP 125/80 | HR 81 | Temp 98.6°F | Resp 25 | Ht 63.0 in | Wt 166.0 lb

## 2018-03-25 DIAGNOSIS — D127 Benign neoplasm of rectosigmoid junction: Secondary | ICD-10-CM | POA: Diagnosis not present

## 2018-03-25 DIAGNOSIS — I10 Essential (primary) hypertension: Secondary | ICD-10-CM | POA: Diagnosis not present

## 2018-03-25 DIAGNOSIS — R195 Other fecal abnormalities: Secondary | ICD-10-CM

## 2018-03-25 DIAGNOSIS — K635 Polyp of colon: Secondary | ICD-10-CM

## 2018-03-25 DIAGNOSIS — D123 Benign neoplasm of transverse colon: Secondary | ICD-10-CM

## 2018-03-25 DIAGNOSIS — D125 Benign neoplasm of sigmoid colon: Secondary | ICD-10-CM

## 2018-03-25 DIAGNOSIS — D120 Benign neoplasm of cecum: Secondary | ICD-10-CM

## 2018-03-25 DIAGNOSIS — D126 Benign neoplasm of colon, unspecified: Secondary | ICD-10-CM | POA: Diagnosis not present

## 2018-03-25 DIAGNOSIS — D122 Benign neoplasm of ascending colon: Secondary | ICD-10-CM

## 2018-03-25 MED ORDER — SODIUM CHLORIDE 0.9 % IV SOLN: 500.0000 mL | Freq: Once | INTRAVENOUS | Status: AC

## 2018-03-25 NOTE — Patient Instructions (Signed)
YOU HAD AN ENDOSCOPIC PROCEDURE TODAY AT [facility]:   Refer to the procedure report that was given to you for any specific questions about what was found during the examination.  If the procedure report does not answer your questions, please call your gastroenterologist to clarify.  If you requested that your care partner not be given the details of your procedure findings, then the procedure report has been included in a sealed envelope for you to review at your convenience later.  YOU SHOULD EXPECT: Some feelings of bloating in the abdomen. Passage of more gas than usual.  Walking can help get rid of the air that was put into your GI tract during the procedure and reduce the bloating. If you had a lower endoscopy (such as a colonoscopy or flexible sigmoidoscopy) you may notice spotting of blood in your stool or on the toilet paper. If you underwent a bowel prep for your procedure, you may not have a normal bowel movement for a few days.  Please Note:  You might notice some irritation and congestion in your nose or some drainage.  This is from the oxygen used during your procedure.  There is no need for concern and it should clear up in a day or so.  SYMPTOMS TO REPORT IMMEDIATELY:   Following lower endoscopy (colonoscopy or flexible sigmoidoscopy):  Excessive amounts of blood in the stool  Significant tenderness or worsening of abdominal pains  Swelling of the abdomen that is new, acute  Fever of 100F or higher   Following upper endoscopy (EGD)  Vomiting of blood or coffee ground material  New chest pain or pain under the shoulder blades  Painful or persistently difficult swallowing  New shortness of breath  Fever of 100F or higher  Black, tarry-looking stools  For urgent or emergent issues, a gastroenterologist can be reached at any hour by calling [phone].   DIET:  We do recommend a small meal at first, but then you may proceed to your regular diet.  Drink  plenty of fluids but you should avoid alcoholic beverages for 24 hours.  ACTIVITY:  You should plan to take it easy for the rest of today and you should NOT DRIVE or use heavy machinery until tomorrow (because of the sedation medicines used during the test).    FOLLOW UP: Our staff will call the number listed on your records the next business day following your procedure to check on you and address any questions or concerns that you may have regarding the information given to you following your procedure. If we do not reach you, we will leave a message.  However, if you are feeling well and you are not experiencing any problems, there is no need to return our call.  We will assume that you have returned to your regular daily activities without incident.  If any biopsies were taken you will be contacted by phone or by letter within the next 1-3 weeks.  Please call us at [phone] if you have not heard about the biopsies in 3 weeks.   SIGNATURES/CONFIDENTIALITY: You and/or your care partner have signed paperwork which will be entered into your electronic medical record.  These signatures attest to the fact that that the information above on your After Visit Summary has been reviewed and is understood.  Full responsibility of the confidentiality of this discharge information lies with you and/or your care-partner.  Await pathology- next colonoscopy 3-5 years  Please read over handouts about  polyps, diverticulosis and hemorrhoids  Continue you normal medications

## 2018-03-25 NOTE — Progress Notes (Signed)
Pt's states no medical or surgical changes since previsit or office visit. 

## 2018-03-25 NOTE — Progress Notes (Signed)
Report given to PACU, vss 

## 2018-03-25 NOTE — Progress Notes (Signed)
Called to room to assist during endoscopic procedure.  Patient ID and intended procedure confirmed with present staff. Received instructions for my participation in the procedure from the performing physician.  

## 2018-03-25 NOTE — Op Note (Addendum)
[place] Patient Name: Samantha Krueger Procedure Date: 03/25/2018 2:36 PM MRN: [id number] Endoscopist: Mauri Pole , MD Age: 67 Referring MD:  Date of Birth: [birth year] Gender: Female Account #: [id number] Procedure:                Colonoscopy Indications:              This is the patient's first colonoscopy, Positive                            Cologuard test Medicines:                Monitored Anesthesia Care Procedure:                Pre-Anesthesia Assessment:                           - Prior to the procedure, a History and Physical                            was performed, and patient medications and                            allergies were reviewed. The patient's tolerance of                            previous anesthesia was also reviewed. The risks                            and benefits of the procedure and the sedation                            options and risks were discussed with the patient.                            All questions were answered, and informed consent                            was obtained. Prior Anticoagulants: The patient has                            taken no previous anticoagulant or antiplatelet                            agents. ASA Grade Assessment: III - A patient with                            severe systemic disease. After reviewing the risks                            and benefits, the patient was deemed in                            satisfactory condition to undergo the procedure.  After obtaining informed consent, the colonoscope                            was passed under direct vision. Throughout the                            procedure, the patient's blood pressure, pulse, and                            oxygen saturations were monitored continuously. The                            Model PCF-[id number]) scope was introduced                            through the anus and advanced to  the the cecum,                            identified by appendiceal orifice and ileocecal                            valve. The colonoscopy was performed without                            difficulty. The patient tolerated the procedure                            well. The quality of the bowel preparation was                            adequate. The ileocecal valve, appendiceal orifice,                            and rectum were photographed. Scope In: 2:49:22 PM Scope Out: 3:15:05 PM Scope Withdrawal Time: 0 hours 18 minutes 6 seconds  Total Procedure Duration: 0 hours 25 minutes 43 seconds  Findings:                 The perianal and digital rectal examinations were                            normal.                           Two sessile polyps were found in the sigmoid colon                            and cecum. The polyps were 1 to 2 mm in size. These                            polyps were removed with a cold biopsy forceps.                            Resection and retrieval were complete.  Three sessile polyps were found in the                            recto-sigmoid colon and ascending colon. The polyps                            were 4 to 7 mm in size. These polyps were removed                            with a cold snare. Resection and retrieval were                            complete.                           Two sessile polyps were found in the sigmoid colon                            and transverse colon. The polyps were 9 to 11 mm in                            size. These polyps were removed with a hot snare.                            Resection and retrieval were complete.                           Multiple small and large-mouthed diverticula were                            found in the sigmoid colon and descending colon.                            There was narrowing of the colon in association                            with the diverticular  opening. There was evidence                            of diverticular spasm. Peri-diverticular erythema                            was seen. There was evidence of an impacted                            diverticulum.                           Non-bleeding internal hemorrhoids were found during                            retroflexion. The hemorrhoids were small. Complications:            No immediate complications. Estimated Blood Loss:  Estimated blood loss was minimal. Impression:               - Two 1 to 2 mm polyps in the sigmoid colon and in                            the cecum, removed with a cold biopsy forceps.                            Resected and retrieved.                           - Three 4 to 7 mm polyps at the recto-sigmoid colon                            and in the ascending colon, removed with a cold                            snare. Resected and retrieved.                           - Two 9 to 11 mm polyps in the sigmoid colon and in                            the transverse colon, removed with a hot snare.                            Resected and retrieved.                           - Severe diverticulosis in the sigmoid colon and in                            the descending colon. There was narrowing of the                            colon in association with the diverticular opening.                            There was evidence of diverticular spasm.                            Peri-diverticular erythema was seen. There was                            evidence of an impacted diverticulum.                           - Non-bleeding internal hemorrhoids. Recommendation:           - Patient has a contact number available for                            emergencies. The signs and symptoms of potential  delayed complications were discussed with the                            patient. Return to normal activities tomorrow.                             Written discharge instructions were provided to the                            patient.                           - Resume previous diet.                           - Continue present medications.                           - Await pathology results.                           - Repeat colonoscopy in 3 - 5 years for                            surveillance based on pathology results. Mauri Pole, MD 03/25/2018 3:23:01 PM This report has been signed electronically.

## 2018-03-26 ENCOUNTER — Telehealth: Payer: Self-pay

## 2018-03-26 NOTE — Telephone Encounter (Signed)
  Follow up Call-  Call back number 03/25/2018  Post procedure Call Back phone  # [phone]  Permission to leave phone message No  Some recent data might be hidden     Patient questions:  Do you have a fever, pain , or abdominal swelling? No. Pain Score  0 *  Have you tolerated food without any problems? Yes.    Have you been able to return to your normal activities? Yes.    Do you have any questions about your discharge instructions: Diet   No. Medications  No. Follow up visit  No.  Do you have questions or concerns about your Care? No.  Actions: * If pain score is 4 or above: No action needed, pain <4.

## 2018-04-03 ENCOUNTER — Encounter: Payer: Self-pay | Admitting: Gastroenterology

## 2018-04-14 ENCOUNTER — Other Ambulatory Visit: Payer: Self-pay | Admitting: Acute Care

## 2018-04-14 DIAGNOSIS — F17210 Nicotine dependence, cigarettes, uncomplicated: Principal | ICD-10-CM

## 2018-04-14 DIAGNOSIS — Z122 Encounter for screening for malignant neoplasm of respiratory organs: Secondary | ICD-10-CM

## 2018-04-28 IMAGING — DX DG CHEST 2V
2 series · 2 of 2 positions shown · non-contrast
Comparison: 01/11/2018

CLINICAL DATA: Recheck possible nodule right lung base

EXAM:
CHEST - 2 VIEW

[chest pa]
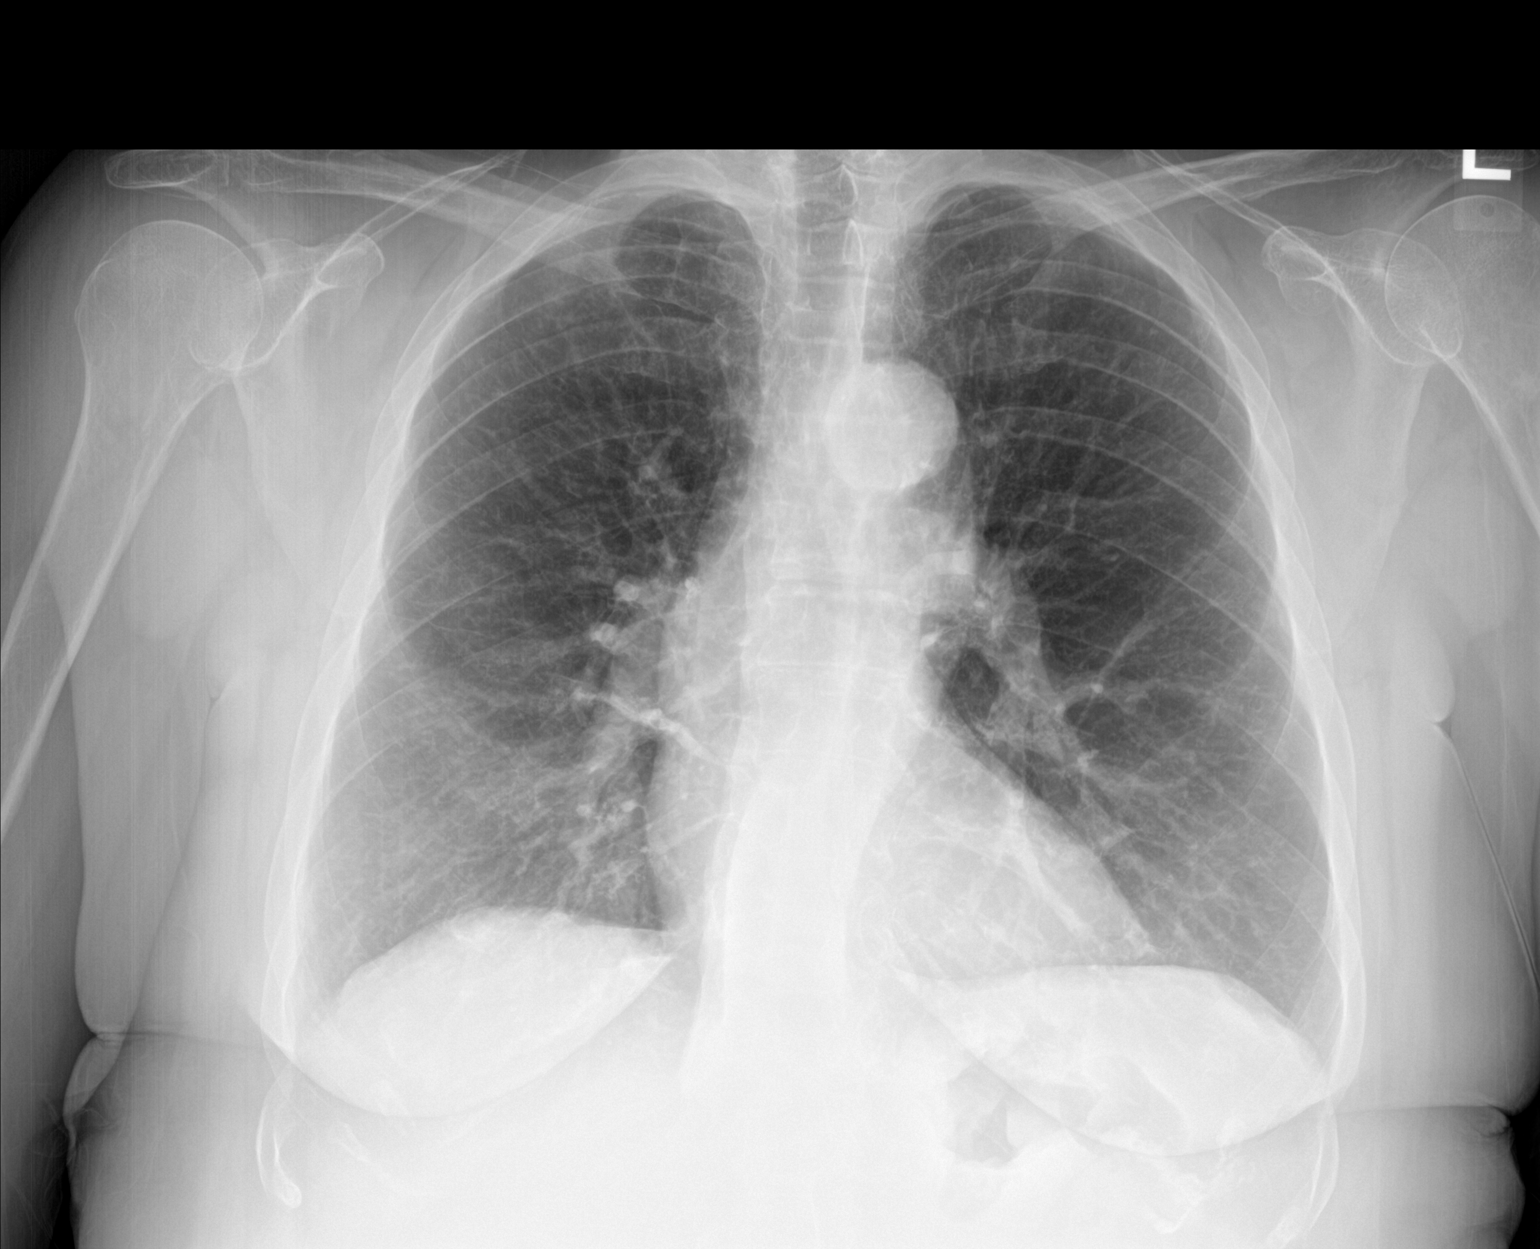

[chest lat]
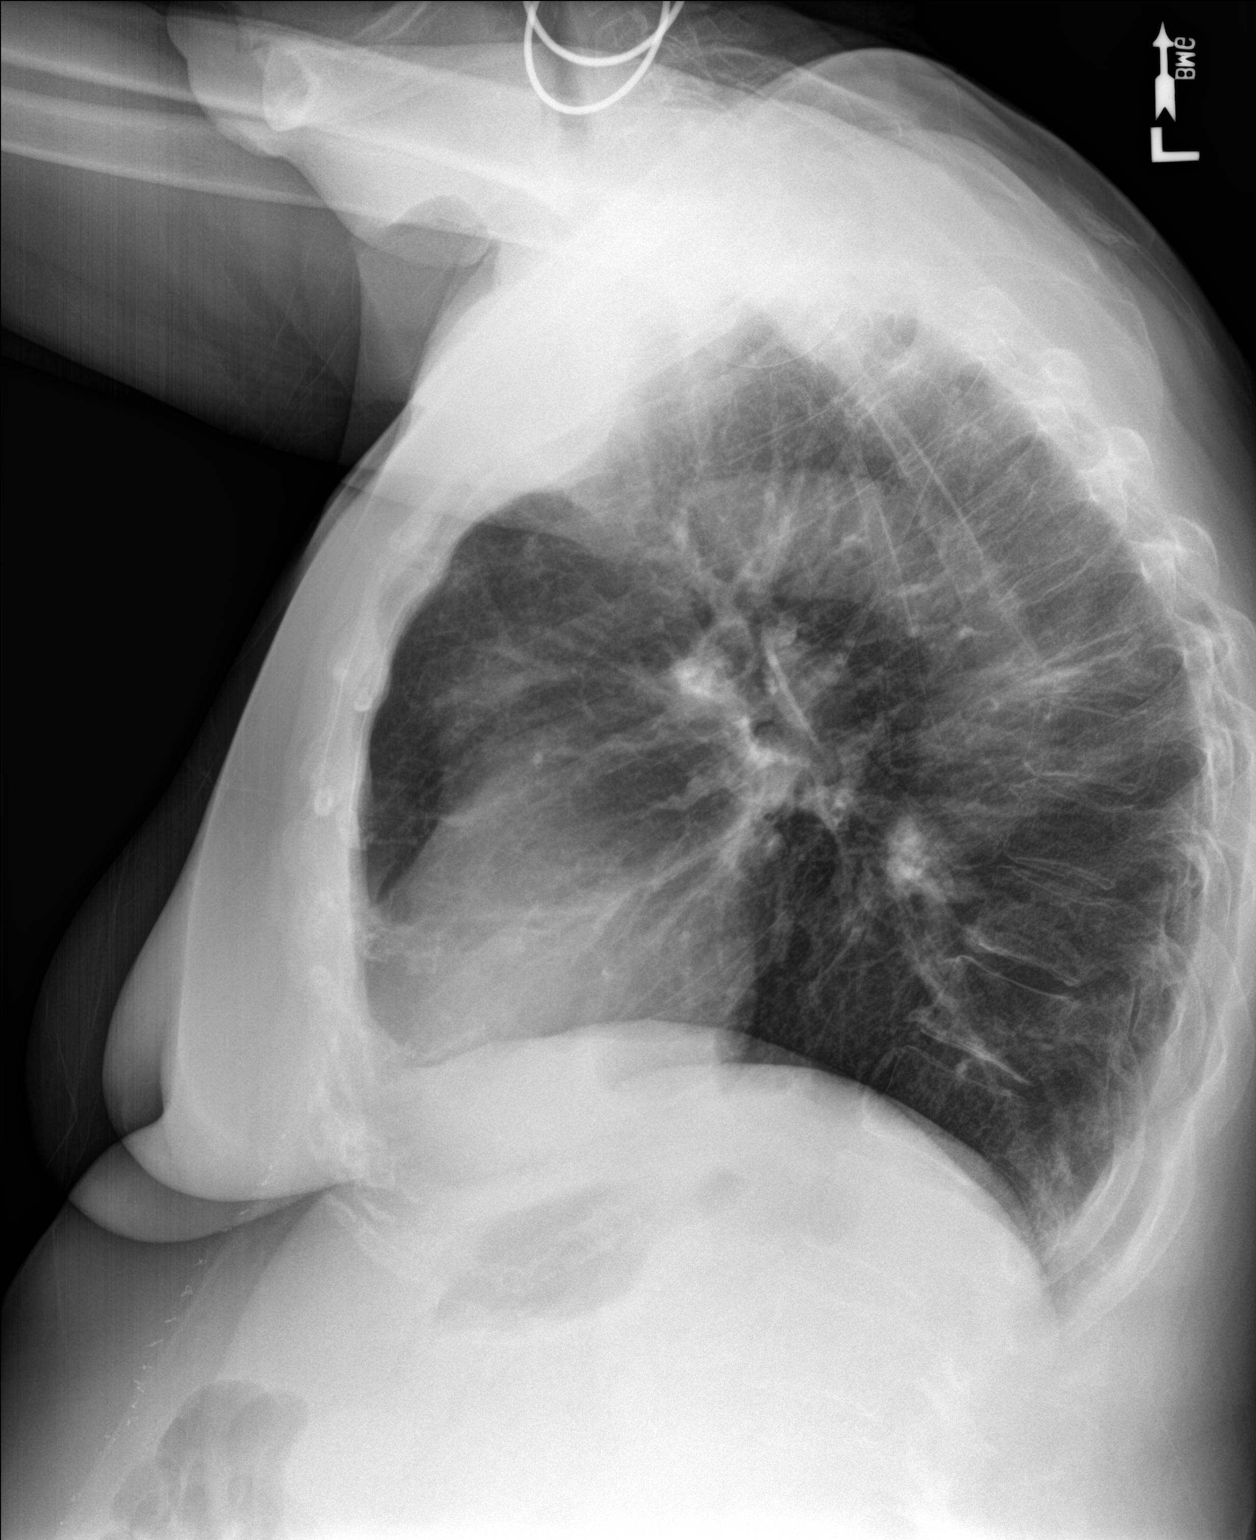

[2 of 2 positions shown; findings below may reference images not displayed]

FINDINGS: Heart and mediastinal contours are within normal limits. No focal
opacities or effusions. No acute bony abnormality. No visible
nodules, in particular at the right lung base.
IMPRESSION: No active cardiopulmonary disease.

## 2018-05-02 ENCOUNTER — Encounter: Payer: Self-pay | Admitting: Acute Care

## 2018-05-02 ENCOUNTER — Ambulatory Visit (INDEPENDENT_AMBULATORY_CARE_PROVIDER_SITE_OTHER): Payer: Medicare Other | Admitting: Acute Care

## 2018-05-02 ENCOUNTER — Ambulatory Visit (INDEPENDENT_AMBULATORY_CARE_PROVIDER_SITE_OTHER)
Admission: RE | Admit: 2018-05-02 | Discharge: 2018-05-02 | Disposition: A | Discharge status: Home / Self Care | Payer: Medicare Other | Source: Ambulatory Visit | Attending: Acute Care | Admitting: Acute Care

## 2018-05-02 DIAGNOSIS — Z122 Encounter for screening for malignant neoplasm of respiratory organs: Secondary | ICD-10-CM

## 2018-05-02 DIAGNOSIS — F17210 Nicotine dependence, cigarettes, uncomplicated: Secondary | ICD-10-CM

## 2018-05-02 NOTE — Progress Notes (Signed)
Shared Decision Making Visit Lung Cancer Screening Program [phone])   Eligibility:  Age 67 y.o.  Pack Years Smoking History Calculation 32 pack year smoking history (# packs/per year x # years smoked)  Recent History of coughing up blood  no  Unexplained weight loss? no ( >Than 15 pounds within the last 6 months )  Prior History Lung / other cancer no (Diagnosis within the last 5 years already requiring surveillance chest CT Scans).  Smoking Status Current Smoker  Former Smokers: Years since quit: NA  Quit Date: NA  Visit Components:  Discussion included one or more decision making aids. yes  Discussion included risk/benefits of screening. yes  Discussion included potential follow up diagnostic testing for abnormal scans. yes  Discussion included meaning and risk of over diagnosis. yes  Discussion included meaning and risk of False Positives. yes  Discussion included meaning of total radiation exposure. yes  Counseling Included:  Importance of adherence to annual lung cancer LDCT screening. yes  Impact of comorbidities on ability to participate in the program. yes  Ability and willingness to under diagnostic treatment. yes  Smoking Cessation Counseling:  Current Smokers:   Discussed importance of smoking cessation. yes  Information about tobacco cessation classes and interventions provided to patient. yes  Patient provided with "ticket" for LDCT Scan. yes  Symptomatic Patient. no  Counseling  Diagnosis Code: Tobacco Use Z72.0  Asymptomatic Patient yes  Counseling (Intermediate counseling: > three minutes counseling) U0454  Former Smokers:   Discussed the importance of maintaining cigarette abstinence. yes  Diagnosis Code: Personal History of Nicotine Dependence. U98.119  Information about tobacco cessation classes and interventions provided to patient. Yes  Patient provided with "ticket" for LDCT Scan. yes  Written Order for Lung Cancer  Screening with LDCT placed in Epic. Yes (CT Chest Lung Cancer Screening Low Dose W/O CM) [id number] Z12.2-Screening of respiratory organs Z87.891-Personal history of nicotine dependence I have spent 25 minutes of face to face time with Samantha Krueger discussing the risks and benefits of lung cancer screening. We viewed a power point together that explained in detail the above noted topics. We paused at intervals to allow for questions to be asked and answered to ensure understanding.We discussed that the single most powerful action that she can take to decrease her risk of developing lung cancer is to quit smoking. We discussed whether or not she is ready to commit to setting a quit date. She is not ready to set a quit date. We discussed options for tools to aid in quitting smoking including nicotine replacement therapy, non-nicotine medications, support groups, Quit Smart classes, and behavior modification. We discussed that often times setting smaller, more achievable goals, such as eliminating 1 cigarette a day for a week and then 2 cigarettes a day for a week can be helpful in slowly decreasing the number of cigarettes smoked. This allows for a sense of accomplishment as well as providing a clinical benefit. I gave her the " Be Stronger Than Your Excuses" card with contact information for community resources, classes, free nicotine replacement therapy, and access to mobile apps, text messaging, and on-line smoking cessation help. I have also given her my card and contact information in the event she needs to contact me. We discussed the time and location of the scan, and that either Doroteo Glassman RN or I will call with the results within 24-48 hours of receiving them. I have offered her  a copy of the power point we viewed  as a  resource in the event they need reinforcement of the concepts we discussed today in the office. The patient verbalized understanding of all of  the above and had no further questions upon  leaving the office. They have my contact information in the event they have any further questions.  I spent 4 minutes counseling on smoking cessation and the health risks of continued tobacco abuse.  I explained to the patient that there has been a high incidence of coronary artery disease noted on these exams. I explained that this is a non-gated exam therefore degree or severity cannot be determined. This patient is not on statin therapy. I have asked the patient to follow-up with their PCP regarding any incidental finding of coronary artery disease and management with diet or medication as their PCP  feels is clinically indicated. The patient verbalized understanding of the above and had no further questions upon completion of the visit.      Magdalen Spatz, NP 05/02/2018 3:37 PM

## 2018-05-13 ENCOUNTER — Telehealth: Payer: Self-pay | Admitting: Acute Care

## 2018-05-13 NOTE — Telephone Encounter (Signed)
No answer. Message left with contact information requesting patient  return call.

## 2018-05-14 ENCOUNTER — Telehealth: Payer: Self-pay | Admitting: Acute Care

## 2018-05-14 DIAGNOSIS — Z122 Encounter for screening for malignant neoplasm of respiratory organs: Secondary | ICD-10-CM

## 2018-05-14 DIAGNOSIS — F17210 Nicotine dependence, cigarettes, uncomplicated: Principal | ICD-10-CM

## 2018-05-14 NOTE — Telephone Encounter (Signed)
Pt informed of CT results per Sarah Groce, NP.  PT verbalized understanding.  Copy sent to PCP.  Order placed for 6 mth  f/u CT. 

## 2018-05-14 NOTE — Telephone Encounter (Signed)
Pt is calling back about the lung cancer screening she want's to know if the results came back.  [phone]

## 2018-05-14 NOTE — Telephone Encounter (Signed)
Samantha Krueger, pt is returning your call for CT results.  Please advise

## 2018-05-21 ENCOUNTER — Telehealth: Payer: Self-pay

## 2018-05-21 NOTE — Telephone Encounter (Signed)
Copied from [place] [id number]. Topic: Inquiry >> May 16, 2018  9:18 AM Arletha Grippe wrote: Reason for CRM: sister linda loftis called, requesting results of ct lung scan.  Please call [phone]   Ordered by Gladstone Pih NP at Pulmonology.  Sent to Gladstone Pih to give results.

## 2018-05-21 NOTE — Telephone Encounter (Signed)
I have called and spoken to [employer]. I reviewed the results of the LDCT with her. She had no further questions. She did ask that we call her with the results of the scans in the future, and to schedule follow up scans as the patient does not understand the results or ask any questions. I told her we would be happy to do that.   Langley Gauss I will let Rodena Piety know to call the [facility] to schedule follow up scans in the future. Thanks so much

## 2018-06-21 ENCOUNTER — Telehealth: Payer: Self-pay | Admitting: Family Medicine

## 2018-06-21 ENCOUNTER — Other Ambulatory Visit: Payer: Self-pay

## 2018-06-21 MED ORDER — MUPIROCIN 2 % EX OINT: 1.0000 "application " | TOPICAL_OINTMENT | Freq: Two times a day (BID) | CUTANEOUS | 1 refills | Status: AC

## 2018-06-21 NOTE — Telephone Encounter (Signed)
Pt called requesting an ointment to be called in for her legs. She said they are still red and have bubbles around them. She said they are not painful. Please advise. Pt can be reached at [phone].

## 2018-06-21 NOTE — Telephone Encounter (Signed)
Spoke with Rose and was advised to have pt make an appointment with Dr. Brigitte Pulse to look at her legs. Pt requested a Saturday afternoon only, so she is scheduled for the first available on 07/12/18. I advised pt to give [place] a call if her symptoms get worse or she feels like she needs to come in. Pt also said she is unable to wear her stockings because they are too small, but wanted to let Dr. Brigitte Pulse know she tried.

## 2018-06-21 NOTE — Telephone Encounter (Signed)
I'm not sure we are going to be able to do anything effective over the phone - topical steroid may work best to treat symptoms but can't rx that until sure there is no infection which of course requires a clinical eval. For now can try mupirocin but not sure it will help  - has she tried the lasix - does it help?

## 2018-06-25 NOTE — Telephone Encounter (Signed)
No answer-- unable to leave message.

## 2018-06-29 IMAGING — CT CT CHEST LUNG CANCER SCREENING LOW DOSE W/O CM
2 of 5 series · 15 of 40 positions shown, 18 images · non-contrast
Comparison: Plain films 03/01/2018.  No prior CT.

CLINICAL DATA: Thirty pack-year smoking history. Current smoker.
Asymptomatic. Remote splenectomy.

EXAM:
CT CHEST WITHOUT CONTRAST LOW-DOSE FOR LUNG CANCER SCREENING
TECHNIQUE: Multidetector CT imaging of the chest was performed following the
standard protocol without IV contrast.

[Series 3: lung thins 1.0 · axial · 0.67mm/px · z∈[-287,-38]mm · 12 of 275 slices shown, 15 images]
[im 13/275  mediastinal]
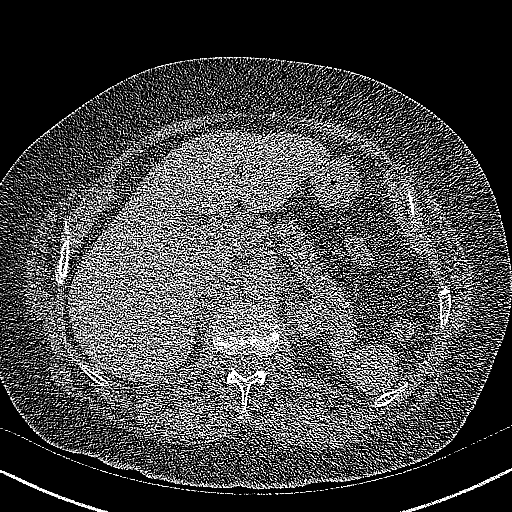
[im 13/275  lung]
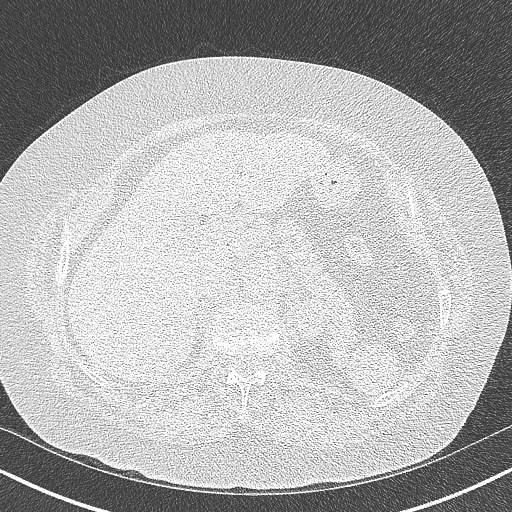
[im 38/275  lung]
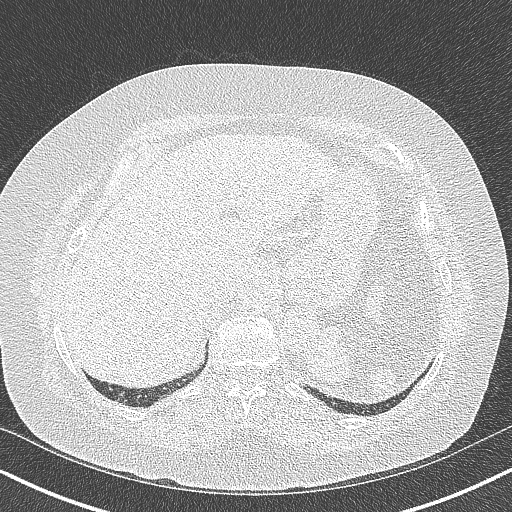
[im 63/275  lung]
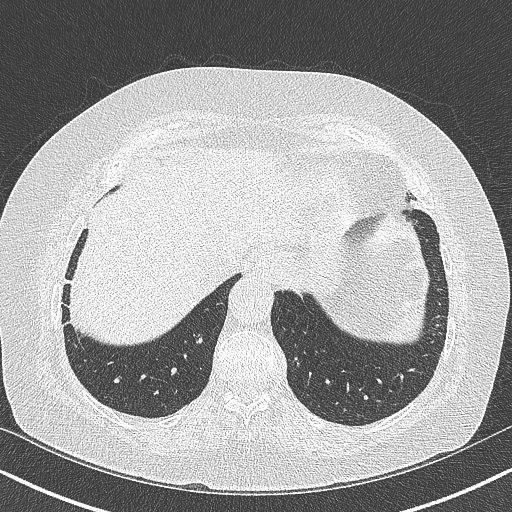
[im 88/275  lung]
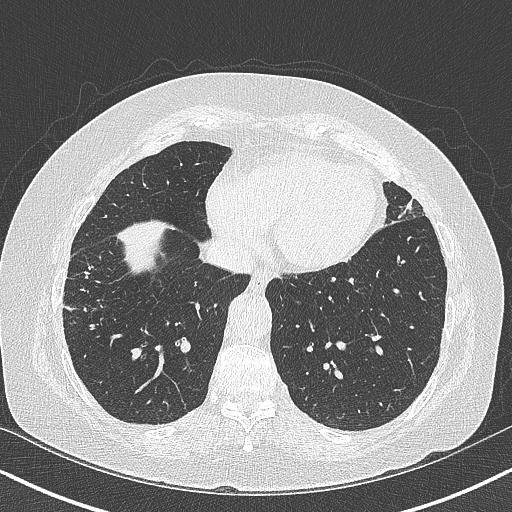
[im 100/275  mediastinal]
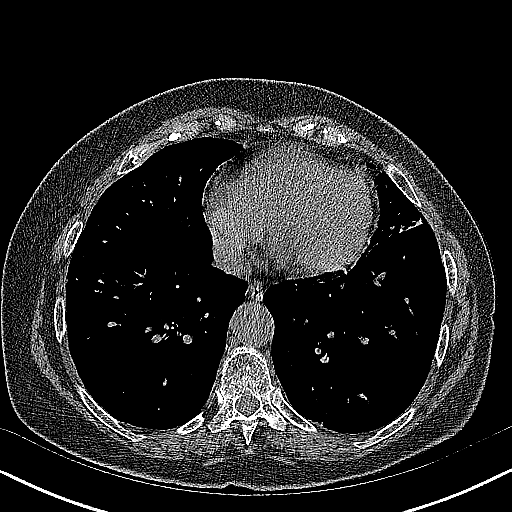
[im 100/275  lung]
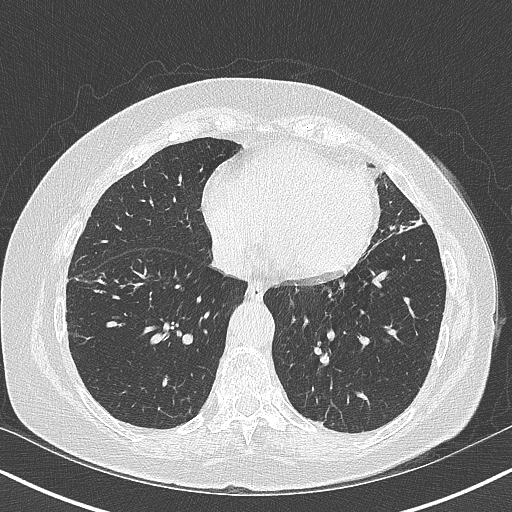
[im 125/275  lung]
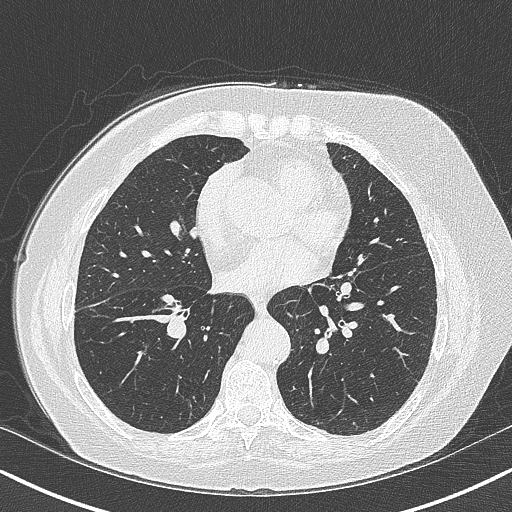
[im 150/275  lung]
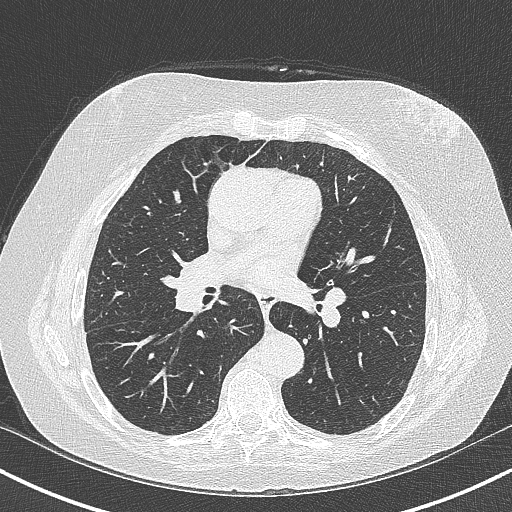
[im 175/275  lung]
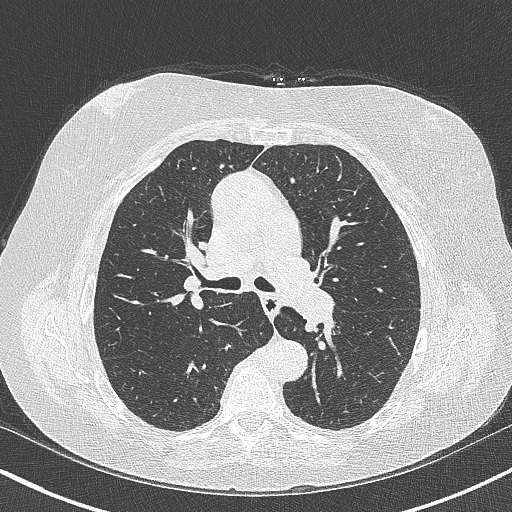
[im 187/275  mediastinal]
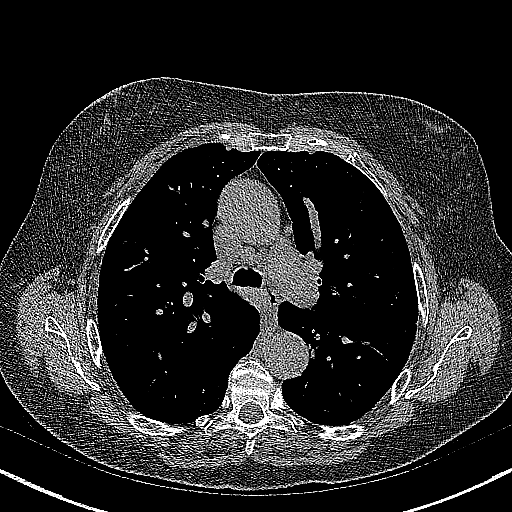
[im 187/275  lung]
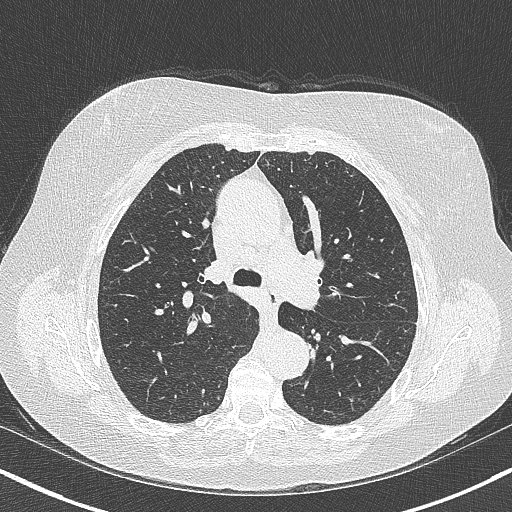
[im 212/275  lung]
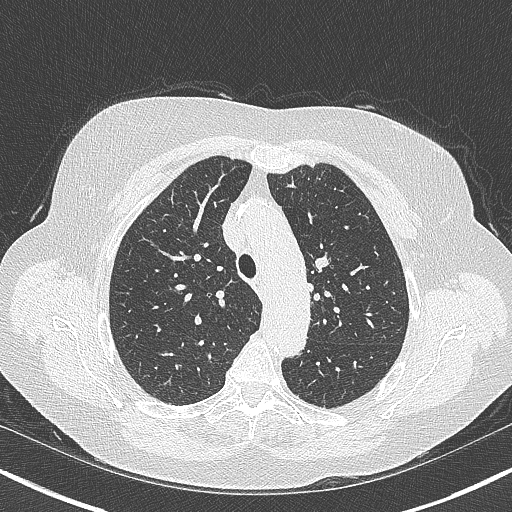
[im 237/275  lung]
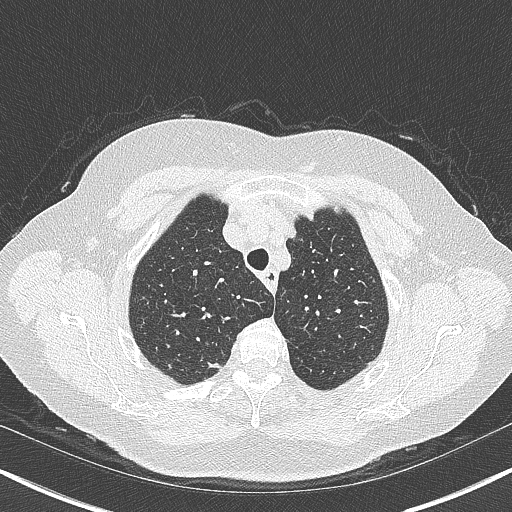
[im 262/275  lung]
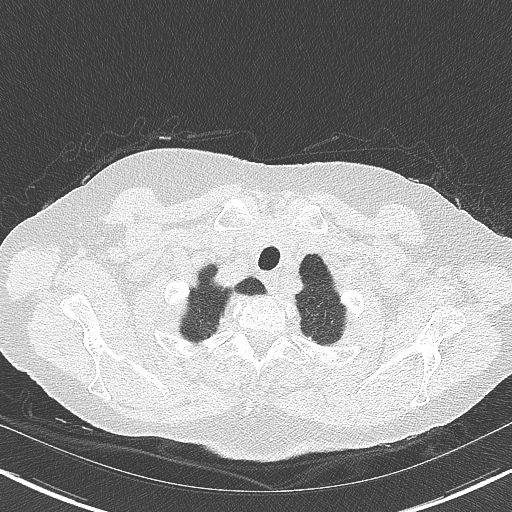

[Series 5: coronal · coronal · 0.59mm/px · 3 of 136 slices shown]
[im 28/136  lung]
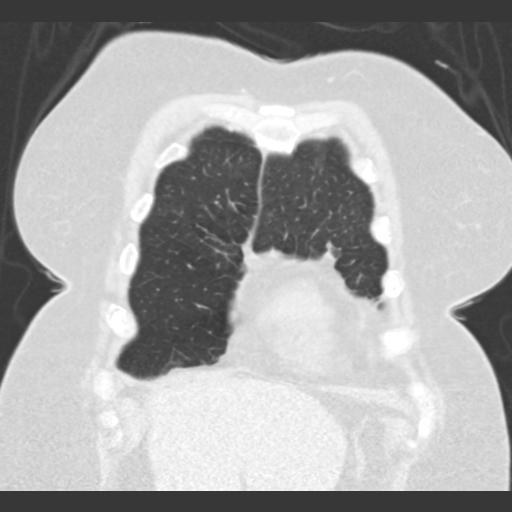
[im 55/136  lung]
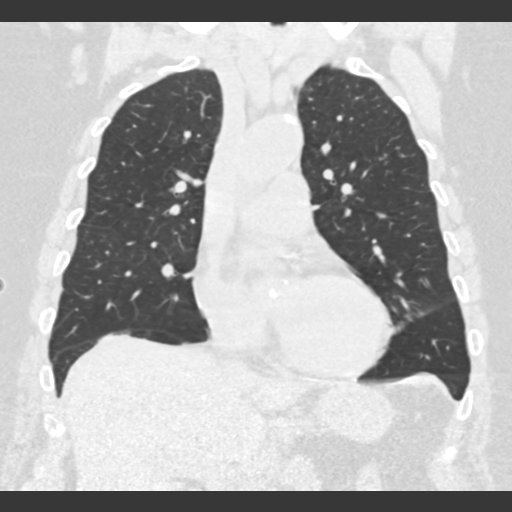
[im 82/136  lung]
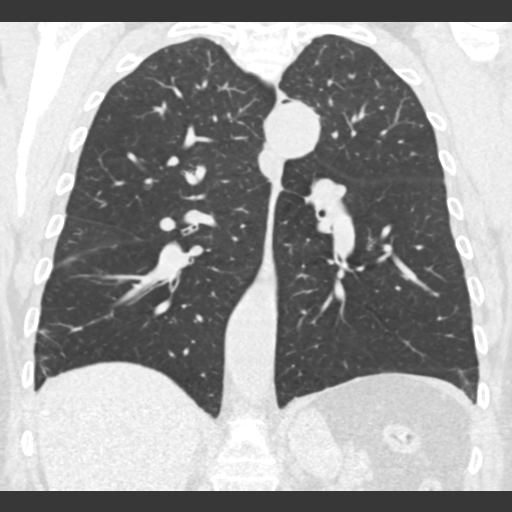

[15 of 40 positions shown; findings below may reference images not displayed]

FINDINGS: Cardiovascular: Borderline to mild ascending aortic dilatation,
including at 4.1 cm on image [DATE] and 3.9 cm on image 47/5. Aortic
and branch vessel atherosclerosis. Tortuous thoracic aorta.
Borderline cardiomegaly, without pericardial effusion. Lipomatous
hypertrophy of the interatrial septum. Lad coronary artery
atherosclerosis.

Mediastinum/Nodes: Borderline enlarged precarinal node at 10 mm on
image [DATE]. Hilar regions poorly evaluated without intravenous
contrast.

Lungs/Pleura: No pleural fluid. Mild to moderate centrilobular
emphysema. Lower lobe predominant bronchial wall thickening.
Posterior right upper lobe subpleural density measures volume
derived equivalent diameter 7.6 mm on image 36/3.

Anterior inferior right lower lobe subpleural density measures
volume derived equivalent diameter 7.7 mm on image 186/3.

A superior segment left lower lobe nodule measures volume derived
equivalent diameter 3.4 mm.

Upper Abdomen: Splenectomy with left upper quadrant splenosis,
including on image 50/2. Normal imaged portions of the liver,
stomach, pancreas. Left greater than right adrenal thickening with
mild left adrenal nodularity, likely due to an adenoma.

Musculoskeletal: Mild osteopenia. Severe compression deformity at
the T7 level, without significant canal encroachment. This is
similar to the prior chest radiograph.
IMPRESSION: 1. Lung-RADS 3, probably benign findings. Short-term follow-up in 6
months is recommended with repeat low-dose chest CT without contrast
(please use the following order, "CT CHEST LCS NODULE FOLLOW-UP W/O
CM"). Subpleural posterior right upper lobe and anterior inferior
right lower lobe densities which may represent foci of scarring but
are technically indeterminate.
2. Aortic atherosclerosis (CK3DI-IWJ.J), coronary artery
atherosclerosis and emphysema (CK3DI-TP7.T).
3. Borderline to mild ascending aortic dilatation. Recommend
attention on follow-up.
4. T7 compression deformity, similar to 03/01/2018.

## 2018-07-12 ENCOUNTER — Ambulatory Visit: Payer: Medicare Other | Admitting: Family Medicine

## 2018-11-06 ENCOUNTER — Ambulatory Visit (INDEPENDENT_AMBULATORY_CARE_PROVIDER_SITE_OTHER)
Admission: RE | Admit: 2018-11-06 | Discharge: 2018-11-06 | Disposition: A | Discharge status: Home / Self Care | Payer: Medicare Other | Source: Ambulatory Visit | Attending: Acute Care | Admitting: Acute Care

## 2018-11-06 DIAGNOSIS — Z122 Encounter for screening for malignant neoplasm of respiratory organs: Secondary | ICD-10-CM

## 2018-11-06 DIAGNOSIS — J439 Emphysema, unspecified: Secondary | ICD-10-CM | POA: Diagnosis not present

## 2018-11-06 DIAGNOSIS — R918 Other nonspecific abnormal finding of lung field: Secondary | ICD-10-CM

## 2018-11-06 DIAGNOSIS — F17210 Nicotine dependence, cigarettes, uncomplicated: Secondary | ICD-10-CM | POA: Diagnosis not present

## 2018-11-14 ENCOUNTER — Telehealth: Payer: Self-pay | Admitting: Acute Care

## 2018-11-14 DIAGNOSIS — Z87891 Personal history of nicotine dependence: Secondary | ICD-10-CM

## 2018-11-14 DIAGNOSIS — F17210 Nicotine dependence, cigarettes, uncomplicated: Principal | ICD-10-CM

## 2018-11-14 DIAGNOSIS — Z122 Encounter for screening for malignant neoplasm of respiratory organs: Secondary | ICD-10-CM

## 2018-11-14 NOTE — Telephone Encounter (Signed)
Pt informed of CT results per Sarah Groce, NP.  PT verbalized understanding.  Copy sent to PCP.  Order placed for 1 yr f/u CT.  

## 2020-01-15 ENCOUNTER — Encounter: Payer: Self-pay | Admitting: Acute Care

## 2021-04-24 ENCOUNTER — Encounter: Payer: Self-pay | Admitting: Gastroenterology

## 2024-04-20 ENCOUNTER — Other Ambulatory Visit: Payer: Self-pay | Admitting: Nurse Practitioner

## 2024-04-20 DIAGNOSIS — J432 Centrilobular emphysema: Secondary | ICD-10-CM

## 2024-05-01 ENCOUNTER — Ambulatory Visit
Admission: RE | Admit: 2024-05-01 | Discharge: 2024-05-01 | Disposition: A | Discharge status: Home / Self Care | Source: Ambulatory Visit | Attending: Nurse Practitioner

## 2024-05-01 DIAGNOSIS — J432 Centrilobular emphysema: Secondary | ICD-10-CM

## 2024-05-21 ENCOUNTER — Other Ambulatory Visit: Payer: Self-pay | Admitting: Nurse Practitioner

## 2024-05-21 DIAGNOSIS — I719 Aortic aneurysm of unspecified site, without rupture: Secondary | ICD-10-CM

## 2024-06-25 ENCOUNTER — Ambulatory Visit
Admission: RE | Admit: 2024-06-25 | Discharge: 2024-06-25 | Disposition: A | Discharge status: Home / Self Care | Source: Ambulatory Visit | Attending: Nurse Practitioner | Admitting: Nurse Practitioner

## 2024-06-25 DIAGNOSIS — I719 Aortic aneurysm of unspecified site, without rupture: Secondary | ICD-10-CM

## 2024-06-25 MED ORDER — IOPAMIDOL (ISOVUE-300) INJECTION 61%
80.0000 mL | Freq: Once | INTRAVENOUS | Status: AC | PRN
  Administered 2024-06-25: 80 mL via INTRAVENOUS

## 2024-07-10 ENCOUNTER — Other Ambulatory Visit (HOSPITAL_BASED_OUTPATIENT_CLINIC_OR_DEPARTMENT_OTHER): Payer: Self-pay | Admitting: Nurse Practitioner

## 2024-07-10 DIAGNOSIS — E2839 Other primary ovarian failure: Secondary | ICD-10-CM
